# Patient Record
Sex: Male | Born: 1960 | Race: White | Hispanic: No | Marital: Single | Smoking: Former smoker
Health system: Southern US, Community
[De-identification: ages and names within clinical notes are randomized; demographics above are authoritative.]

## PROBLEM LIST (undated history)

## (undated) DIAGNOSIS — M1990 Unspecified osteoarthritis, unspecified site: Secondary | ICD-10-CM

## (undated) DIAGNOSIS — I723 Aneurysm of iliac artery: Secondary | ICD-10-CM

## (undated) DIAGNOSIS — J449 Chronic obstructive pulmonary disease, unspecified: Secondary | ICD-10-CM

## (undated) DIAGNOSIS — E785 Hyperlipidemia, unspecified: Secondary | ICD-10-CM

## (undated) DIAGNOSIS — I10 Essential (primary) hypertension: Secondary | ICD-10-CM

## (undated) DIAGNOSIS — M7021 Olecranon bursitis, right elbow: Secondary | ICD-10-CM

## (undated) DIAGNOSIS — Z720 Tobacco use: Secondary | ICD-10-CM

## (undated) DIAGNOSIS — T8859XA Other complications of anesthesia, initial encounter: Secondary | ICD-10-CM

## (undated) HISTORY — PX: OTHER SURGICAL HISTORY: SHX169

## (undated) HISTORY — DX: Tobacco use: Z72.0

## (undated) HISTORY — DX: Hyperlipidemia, unspecified: E78.5

## (undated) HISTORY — PX: INGUINAL HERNIA REPAIR: SUR1180

## (undated) HISTORY — PX: ULNAR NERVE REPAIR: SHX2594

---

## 1997-12-13 ENCOUNTER — Emergency Department (HOSPITAL_COMMUNITY): Admission: EM | Admit: 1997-12-13 | Discharge: 1997-12-13 | Payer: Self-pay | Admitting: Emergency Medicine

## 1999-11-23 LAB — HM COLONOSCOPY: HM Colonoscopy: NORMAL

## 2000-10-21 ENCOUNTER — Emergency Department (HOSPITAL_COMMUNITY): Admission: EM | Admit: 2000-10-21 | Discharge: 2000-10-21 | Payer: Self-pay | Admitting: Internal Medicine

## 2005-06-19 ENCOUNTER — Ambulatory Visit (HOSPITAL_COMMUNITY): Admission: RE | Admit: 2005-06-19 | Discharge: 2005-06-19 | Payer: Self-pay | Admitting: Specialist

## 2011-10-06 ENCOUNTER — Encounter (HOSPITAL_COMMUNITY): Payer: Self-pay | Admitting: Emergency Medicine

## 2011-10-06 ENCOUNTER — Emergency Department (HOSPITAL_COMMUNITY): Payer: BC Managed Care – PPO

## 2011-10-06 ENCOUNTER — Emergency Department (HOSPITAL_COMMUNITY)
Admission: EM | Admit: 2011-10-06 | Discharge: 2011-10-06 | Disposition: A | Discharge status: Home / Self Care | Payer: BC Managed Care – PPO | Attending: Emergency Medicine | Admitting: Emergency Medicine

## 2011-10-06 DIAGNOSIS — R110 Nausea: Secondary | ICD-10-CM | POA: Insufficient documentation

## 2011-10-06 DIAGNOSIS — R1031 Right lower quadrant pain: Secondary | ICD-10-CM | POA: Insufficient documentation

## 2011-10-06 LAB — URINALYSIS, ROUTINE W REFLEX MICROSCOPIC
Protein, ur: NEGATIVE mg/dL
Urobilinogen, UA: 1 mg/dL (ref 0.0–1.0)

## 2011-10-06 LAB — DIFFERENTIAL
Basophils Absolute: 0 10*3/uL (ref 0.0–0.1)
Basophils Relative: 0 % (ref 0–1)
Eosinophils Absolute: 0.2 10*3/uL (ref 0.0–0.7)
Eosinophils Relative: 2 % (ref 0–5)
Lymphocytes Relative: 35 % (ref 12–46)
Lymphs Abs: 2.6 10*3/uL (ref 0.7–4.0)
Monocytes Absolute: 0.4 10*3/uL (ref 0.1–1.0)
Monocytes Relative: 6 % (ref 3–12)
Neutro Abs: 4.1 10*3/uL (ref 1.7–7.7)
Neutrophils Relative %: 56 % (ref 43–77)

## 2011-10-06 LAB — CBC
Hemoglobin: 17 g/dL (ref 13.0–17.0)
MCH: 30.5 pg (ref 26.0–34.0)
MCV: 89.6 fL (ref 78.0–100.0)
Platelets: 176 10*3/uL (ref 150–400)
RBC: 5.58 MIL/uL (ref 4.22–5.81)

## 2011-10-06 LAB — URINE MICROSCOPIC-ADD ON

## 2011-10-06 LAB — BASIC METABOLIC PANEL
BUN: 15 mg/dL (ref 6–23)
Calcium: 9.1 mg/dL (ref 8.4–10.5)
Creatinine, Ser: 1.1 mg/dL (ref 0.50–1.35)
GFR calc non Af Amer: 77 mL/min — ABNORMAL LOW (ref >90)
Glucose, Bld: 154 mg/dL — ABNORMAL HIGH (ref 70–99)

## 2011-10-06 MED ORDER — ONDANSETRON HCL 4 MG/2ML IJ SOLN
4.0000 mg | Freq: Once | INTRAMUSCULAR | Status: AC
Start: 2011-10-06 — End: 2011-10-06
  Administered 2011-10-06: 4 mg via INTRAVENOUS
  Filled 2011-10-06: qty 2

## 2011-10-06 MED ORDER — KETOROLAC TROMETHAMINE 30 MG/ML IJ SOLN
30.0000 mg | Freq: Once | INTRAMUSCULAR | Status: AC
  Administered 2011-10-06: 30 mg via INTRAVENOUS
  Filled 2011-10-06: qty 1

## 2011-10-06 MED ORDER — ONDANSETRON HCL 4 MG PO TABS: 4.0000 mg | ORAL_TABLET | Freq: Four times a day (QID) | ORAL | Status: AC

## 2011-10-06 MED ORDER — HYDROCODONE-ACETAMINOPHEN 5-325 MG PO TABS: 2.0000 | ORAL_TABLET | ORAL | Status: AC | PRN

## 2011-10-06 MED ORDER — MORPHINE SULFATE 4 MG/ML IJ SOLN
4.0000 mg | Freq: Once | INTRAMUSCULAR | Status: AC
  Administered 2011-10-06: 4 mg via INTRAVENOUS
  Filled 2011-10-06: qty 1

## 2011-10-06 MED ORDER — IBUPROFEN 600 MG PO TABS: 600.0000 mg | ORAL_TABLET | Freq: Four times a day (QID) | ORAL | Status: AC | PRN

## 2011-10-06 MED ORDER — HYDROMORPHONE HCL PF 1 MG/ML IJ SOLN
1.0000 mg | Freq: Once | INTRAMUSCULAR | Status: AC
  Administered 2011-10-06: 1 mg via INTRAVENOUS

## 2011-10-06 MED ORDER — HYDROMORPHONE HCL PF 1 MG/ML IJ SOLN
1.0000 mg | Freq: Once | INTRAMUSCULAR | Status: AC
  Administered 2011-10-06: 1 mg via INTRAVENOUS
  Filled 2011-10-06: qty 1

## 2011-10-06 MED ORDER — IOHEXOL 300 MG/ML  SOLN
100.0000 mL | Freq: Once | INTRAMUSCULAR | Status: AC | PRN
  Administered 2011-10-06: 100 mL via INTRAVENOUS

## 2011-10-06 MED ORDER — ONDANSETRON 4 MG PO TBDP
4.0000 mg | ORAL_TABLET | Freq: Once | ORAL | Status: AC
  Administered 2011-10-06: 4 mg via ORAL
  Filled 2011-10-06: qty 1

## 2011-10-06 NOTE — ED Notes (Signed)
Pt presented to the ER with c/o right lowe quadrant pain, 10/10, sharp and constant, pt states that pain started yesterday, not able to control, this am woken up by pain and in "sweat", pt denies taking any meds at home. Pt further states that no Hx of abd pain.

## 2011-10-06 NOTE — ED Provider Notes (Signed)
History     CSN: [id number]  Arrival date & time 10/06/11  0272   First MD Initiated Contact with Patient 10/06/11 [phone]      Chief Complaint  Patient presents with  . Abdominal Pain    right lower quadrant    (Consider location/radiation/quality/duration/timing/severity/associated sxs/prior treatment) HPI  Pt presents to the ED with complaints of RLQ abdominal pain that started yesterday. He had some nausea with it. This morning the pain became unbearable with a 10/10 pain that is sharp, therefore he came to the ED. He has not tried to take anything for this problem yet. He denies history of abdominal pains and states that he still has his appendix and gallbladder. The patient denies any other concerns at this time which includes weakness, fatigue, CP, SOB, wheezing, confusion.  History reviewed. No pertinent past medical history.  Past Surgical History  Procedure Date  . Hernia repair   . Elbow surgery     History reviewed. No pertinent family history.  History  Substance Use Topics  . Smoking status: Current Some Day Smoker  . Smokeless tobacco: Not on file  . Alcohol Use: No      Review of Systems  All other systems reviewed and are negative.    Allergies  Review of patient's allergies indicates no known allergies.  Home Medications   Current Outpatient Rx  Name Route Sig Dispense Refill  . HYDROCODONE-ACETAMINOPHEN 5-325 MG PO TABS Oral Take 2 tablets by mouth every 4 (four) hours as needed for pain. 15 tablet 0  . IBUPROFEN 600 MG PO TABS Oral Take 1 tablet (600 mg total) by mouth every 6 (six) hours as needed for pain. 30 tablet 0  . ONDANSETRON HCL 4 MG PO TABS Oral Take 1 tablet (4 mg total) by mouth every 6 (six) hours. 12 tablet 0    BP 131/86  Pulse 75  Temp(Src) 98 F (36.7 C) (Oral)  Resp 18  Wt 220 lb (99.791 kg)  SpO2 95%  Physical Exam  Nursing note and vitals reviewed. Constitutional: He appears well-developed and well-nourished. No  distress.  HENT:  Head: Normocephalic and atraumatic.  Eyes: Pupils are equal, round, and reactive to light.  Neck: Normal range of motion. Neck supple.  Cardiovascular: Normal rate and regular rhythm.   Pulmonary/Chest: Effort normal.  Abdominal: Soft. Bowel sounds are normal. There is tenderness in the right lower quadrant. There is guarding. There is no rigidity, no rebound, no tenderness at McBurney's point and negative Murphy's sign.    Neurological: He is alert.  Skin: Skin is warm and dry.    ED Course  Procedures (including critical care time)  Labs Reviewed  BASIC METABOLIC PANEL - Abnormal; Notable for the following:    Glucose, Bld 154 (*)    GFR calc non Af Amer 77 (*)    GFR calc Af Amer 89 (*)    All other components within normal limits  URINALYSIS, ROUTINE W REFLEX MICROSCOPIC - Abnormal; Notable for the following:    Specific Gravity, Urine 1.031 (*)    Hgb urine dipstick MODERATE (*)    Bilirubin Urine SMALL (*)    Ketones, ur TRACE (*)    Leukocytes, UA SMALL (*)    All other components within normal limits  URINE MICROSCOPIC-ADD ON - Abnormal; Notable for the following:    Bacteria, UA FEW (*)    All other components within normal limits  CBC  DIFFERENTIAL   Ct Abdomen Pelvis W Contrast  10/06/2011  *RADIOLOGY REPORT*  Clinical Data: Right lower quadrant pain for 2 days.  CT ABDOMEN AND PELVIS WITH CONTRAST  Technique:  Multidetector CT imaging of the abdomen and pelvis was performed following the standard protocol during bolus administration of intravenous contrast.  Contrast:  100 ml Omnipaque-300.  Comparison: None.  Findings: Diffuse inflammation fat planes of the ascending colon laterally and anteriorly having an appearance most suggestive of omental infarct.  Epiploic appendagitis is a secondary less likely consideration.  The appendix is separate from this region and does not appear inflamed.  No cecal diverticula seen as a cause for this inflammation.   No abscess collection, free fluid or free air.  Three too small to characterize liver lesions statistically representing cysts or hamartomas.  No focal splenic, pancreatic, adrenal or right renal lesion. Left renal 9 mm low density lesion probably cyst although too small to characterize.  No calcified gallstones.  Advanced atherosclerotic type changes for patient's age with plaque along the abdominal aorta and possibly coronary artery calcifications.  The aorta is ectatic with mild bulge infrarenal position at the takeoff of the inferior mesenteric artery with transverse dimension of 2.4 x 2.3 cm whereas above this level of the aorta measures 2.1 x 1.8 cm.  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  Post stenotic dilation celiac artery.  Asymmetric calcifications of the prostate gland with nodularity on the left.  Clinical and laboratory correlation recommended to exclude malignancy.  Noncontrast filled views of the urinary bladder unremarkable.  Degenerative changes lumbar spine without bony destructive lesion.  Mild fatty containing right inguinal hernia.  Bilateral hip joint degenerative changes.  IMPRESSION:  Diffuse inflammation fat planes of the ascending colon laterally and anteriorly having an appearance most suggestive of omental infarct.  Please see above discussion.  Advanced atherosclerotic type changes for patient's age with plaque along the abdominal aorta and possibly coronary artery calcifications.  The aorta is ectatic with mild bulge infrarenal position at the takeoff of the inferior mesenteric artery with transverse dimension of 2.4 x 2.3 cm whereas above this level of the aorta measures 2.1 x 1.8 cm.  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  Asymmetric calcifications of the prostate gland with nodularity on the left.  Clinical and laboratory correlation recommended to exclude  malignancy.  Original Report Authenticated By: Fuller Canada, M.D.     1. Abdominal pain       MDM  Pt CT shows omentum infarct vs Epiploic Appendagitis. Both of which the treatment for is pain management. Pt given Rx for Ibuprofen 600mg  and Vicodin.  Pt discussed with Dr. Patria Mane who has gone to speak with and evaluate patient.   Pt given 3mg  IV dilaudid and 30mg  IV Toradol to Control pain. Pt has family friends here to take him home and keep an eye one him.  Pt has been advised of the symptoms that warrant their return to the ED. Patient has voiced understanding and has agreed to follow-up with the PCP or specialist.         Dorthula Matas, PA 10/06/11 1044

## 2011-10-06 NOTE — ED Provider Notes (Signed)
Medical screening examination/treatment/procedure(s) were conducted as a shared visit with non-physician practitioner(s) and myself.  I personally evaluated the patient during the encounter  The patient's pain is improved at this time.  I personally reviewed his CT scan it appears as though he has either omental infarct versus and epiploic appendagitis.  His appendix is normal on CT scan.  His white count and vital signs are normal.  DC home with pain medications.  The patient understands to return to the ER for new or worsening symptoms  1. Abdominal pain    Ct Abdomen Pelvis W Contrast  10/06/2011  *RADIOLOGY REPORT*  Clinical Data: Right lower quadrant pain for 2 days.  CT ABDOMEN AND PELVIS WITH CONTRAST  Technique:  Multidetector CT imaging of the abdomen and pelvis was performed following the standard protocol during bolus administration of intravenous contrast.  Contrast:  100 ml Omnipaque-300.  Comparison: None.  Findings: Diffuse inflammation fat planes of the ascending colon laterally and anteriorly having an appearance most suggestive of omental infarct.  Epiploic appendagitis is a secondary less likely consideration.  The appendix is separate from this region and does not appear inflamed.  No cecal diverticula seen as a cause for this inflammation.  No abscess collection, free fluid or free air.  Three too small to characterize liver lesions statistically representing cysts or hamartomas.  No focal splenic, pancreatic, adrenal or right renal lesion. Left renal 9 mm low density lesion probably cyst although too small to characterize.  No calcified gallstones.  Advanced atherosclerotic type changes for patient's age with plaque along the abdominal aorta and possibly coronary artery calcifications.  The aorta is ectatic with mild bulge infrarenal position at the takeoff of the inferior mesenteric artery with transverse dimension of 2.4 x 2.3 cm whereas above this level of the aorta measures 2.1 x 1.8  cm.  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  Post stenotic dilation celiac artery.  Asymmetric calcifications of the prostate gland with nodularity on the left.  Clinical and laboratory correlation recommended to exclude malignancy.  Noncontrast filled views of the urinary bladder unremarkable.  Degenerative changes lumbar spine without bony destructive lesion.  Mild fatty containing right inguinal hernia.  Bilateral hip joint degenerative changes.  IMPRESSION:  Diffuse inflammation fat planes of the ascending colon laterally and anteriorly having an appearance most suggestive of omental infarct.  Please see above discussion.  Advanced atherosclerotic type changes for patient's age with plaque along the abdominal aorta and possibly coronary artery calcifications.  The aorta is ectatic with mild bulge infrarenal position at the takeoff of the inferior mesenteric artery with transverse dimension of 2.4 x 2.3 cm whereas above this level of the aorta measures 2.1 x 1.8 cm.  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  Asymmetric calcifications of the prostate gland with nodularity on the left.  Clinical and laboratory correlation recommended to exclude malignancy.  Original Report Authenticated By: Fuller Canada, M.D.    Lyanne Co, MD 10/06/11 1104

## 2011-10-06 NOTE — ED Notes (Signed)
Continues to be pale, dry heaves, wanting to go home. Discharged in w/c with his friend.

## 2011-10-06 NOTE — ED Notes (Signed)
Continues to have dry heaves, since last dose of dilaudid- IV d/c'd-

## 2011-10-06 NOTE — Discharge Instructions (Signed)

## 2011-10-09 ENCOUNTER — Telehealth: Payer: Self-pay | Admitting: Gastroenterology

## 2011-10-09 NOTE — Telephone Encounter (Signed)
Pt has been scheduled with Gunnar Fusi for 10/10/11 830 am

## 2011-10-10 ENCOUNTER — Encounter: Payer: Self-pay | Admitting: Nurse Practitioner

## 2011-10-10 ENCOUNTER — Ambulatory Visit (INDEPENDENT_AMBULATORY_CARE_PROVIDER_SITE_OTHER): Payer: BC Managed Care – PPO | Admitting: Nurse Practitioner

## 2011-10-10 VITALS — BP 120/94 | HR 96 | Ht 76.0 in | Wt 210.0 lb

## 2011-10-10 DIAGNOSIS — R933 Abnormal findings on diagnostic imaging of other parts of digestive tract: Secondary | ICD-10-CM

## 2011-10-10 DIAGNOSIS — R1110 Vomiting, unspecified: Secondary | ICD-10-CM | POA: Insufficient documentation

## 2011-10-10 MED ORDER — PROMETHAZINE HCL 25 MG RE SUPP: 25.0000 mg | RECTAL | Status: DC | PRN

## 2011-10-10 MED ORDER — METRONIDAZOLE 250 MG PO TABS: ORAL_TABLET | ORAL | Status: DC

## 2011-10-10 NOTE — Patient Instructions (Addendum)
You have been given a separate informational sheet regarding your tobacco use, the importance of quitting and local resources to help you quit. . We sent prescriptions for Phenergan suppositories and Flagyl ( antibiotic).    We will call you with appointments for the Vascular [facility] Vein specialists and [facility].

## 2011-10-11 ENCOUNTER — Encounter: Payer: Self-pay | Admitting: Nurse Practitioner

## 2011-10-11 NOTE — Progress Notes (Signed)
10/11/2011 Rodney Williams [id number] April 13, 1961   HISTORY OF PRESENT ILLNESS: Patient is a 51 year old male who woke up last Friday    morning with severe RLQ pain, nausea, vomiting, and a headache. He went to the ED where labs were unremarkable but CT scan of abdomen and pelvis showed diffuse inflammation of the fat planes of the ascending colon suggestive of epiploic appendigitis vrs.omental infarct. The RLQ pain is better, mainly just a pressure type discomfort now, but he continues to have frequent nausea and vomiting. Zofran helps but no sustained response. Emesis not bloody, no melena.    History reviewed. No pertinent past medical history. Past Surgical History  Procedure Date  . Inguinal hernia repair     left  . Ulnar nerve repair     left    reports that he has been smoking.  He has never used smokeless tobacco. He reports that he does not drink alcohol or use illicit drugs. family history includes Diabetes in his father. No Known Allergies    Outpatient Encounter Prescriptions as of 10/10/2011  Medication Sig Dispense Refill  . HYDROcodone-acetaminophen (NORCO) 5-325 MG per tablet Take 2 tablets by mouth every 4 (four) hours as needed for pain.  15 tablet  0  . ibuprofen (ADVIL,MOTRIN) 600 MG tablet Take 1 tablet (600 mg total) by mouth every 6 (six) hours as needed for pain.  30 tablet  0  . ondansetron (ZOFRAN) 4 MG tablet Take 1 tablet (4 mg total) by mouth every 6 (six) hours.  12 tablet  0  . metroNIDAZOLE (FLAGYL) 250 MG tablet Take 1 tab 3 times a day for 10 days..  30 tablet  0  . promethazine (PHENERGAN) 25 MG suppository Place 1 suppository (25 mg total) rectally every 4 (four) hours as needed for nausea.  12 each  1   CT ABDOMEN AND PELVIS WITH CONTRAST  Technique: Multidetector CT imaging of the abdomen and pelvis was  performed following the standard protocol during bolus  administration of intravenous contrast.  Contrast: 100 ml Omnipaque-300.  Comparison:  None.  Findings: Diffuse inflammation fat planes of the ascending colon  laterally and anteriorly having an appearance most suggestive of  omental infarct. Epiploic appendagitis is a secondary less likely  consideration. The appendix is separate from this region and does  not appear inflamed. No cecal diverticula seen as a cause for this  inflammation. No abscess collection, free fluid or free air.  Three too small to characterize liver lesions statistically  representing cysts or hamartomas.  No focal splenic, pancreatic, adrenal or right renal lesion. Left  renal 9 mm low density lesion probably cyst although too small to   characterize.  No calcified gallstones.  Advanced atherosclerotic type changes for patient's age with plaque  along the abdominal aorta and possibly coronary artery  calcifications. The aorta is ectatic with mild bulge infrarenal  position at the takeoff of the inferior mesenteric artery with  transverse dimension of 2.4 x 2.3 cm whereas above this level of  the aorta measures 2.1 x 1.8 cm.  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  Post stenotic dilation celiac artery.  Asymmetric calcifications of the prostate gland with nodularity on  the left. Clinical and laboratory correlation recommended to  exclude malignancy.  Noncontrast filled views of the urinary bladder unremarkable.  Degenerative changes lumbar spine without bony destructive lesion.  Mild fatty containing right inguinal hernia.  Bilateral hip joint degenerative changes.  IMPRESSION:  Diffuse inflammation fat planes of the ascending colon laterally  and anteriorly having an appearance most suggestive of omental  infarct. Please see above discussion.  Advanced atherosclerotic type changes for patient's age with plaque  along the abdominal aorta and possibly coronary artery  calcifications. The aorta is ectatic with mild bulge infrarenal    position at the takeoff of the inferior mesenteric artery with  transverse dimension of 2.4 x 2.3 cm whereas above this level of  the aorta measures 2.1 x 1.8 cm.  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  Asymmetric calcifications of the prostate gland with nodularity on  the left. Clinical and laboratory correlation recommended to  exclude malignancy.  Original Report Authenticated By: Fuller Canada, M.D.      REVIEW OF SYSTEMS  : All other systems reviewed and negative except where noted in the History of Present Illness.   PHYSICAL EXAM: BP 120/94  Pulse 96  Ht 6\' 4"  (1.93 m)  Wt 210 lb (95.255 kg)  BMI 25.56 kg/m2 General: Well developed white male in no acute distress Head: Normocephalic and atraumatic Eyes:  sclerae anicteric,conjunctive pink. Ears: Normal auditory acuity Mouth: No deformity or lesions Neck: Supple, no masses.  Lungs: Clear throughout to auscultation Heart: Regular rate and rhythm Abdomen: Soft, non distended, mild -moderate RLQ tenderness. No masses or hepatomegaly noted. Normal Bowel sounds Rectal: not done Musculoskeletal: Symmetrical with no gross deformities  Skin: No lesions on visible extremities Extremities: No edema or deformities noted Neurological: Alert oriented, grossly nonfocal Cervical Nodes:  No significant cervical adenopathy Psychological:  Alert and cooperative. Normal mood and affect  ASSESSMENT AND PLAN;  1. Epiploic appendagitis vrs omental infarct, radiologist favors later. Etiology of an omental infarct not clear but there are several vascular abnormalities on CTscan which need to be evaluated. His pain has improved.  Epiploic appendagitis is usually a self-limiting condition but if this was an infarct patient may be at risk for future episodes. We have referred him to the vascular clinic.   2. nausea and vomiting, started with #1. Etiology not clear but he may not be  absorbing the Zofran tablets. Will change him to Phernergan suppositories, push oral fluids. If no improvement in next few days patient may need EGD.   3. Abnormal prostate on CTscan. Patient doesn't have a PCP. We have referred him to [facility] who can further evaluate prostate and refer him to urology is necessary.  3. Tobacco. We discussed need to discontinue smoking, especially in light of vascular disease.

## 2011-10-13 ENCOUNTER — Telehealth: Payer: Self-pay | Admitting: Nurse Practitioner

## 2011-10-13 ENCOUNTER — Encounter: Payer: Self-pay | Admitting: *Deleted

## 2011-10-13 NOTE — Telephone Encounter (Signed)
Spoke with patient and he states he has stopped all medications except antibiotics. He is feeling"shakey and his heart rate has been up(112). States his BP is normal. He is concerned the Flagyl is causing this. Spoke with Willette Cluster, NP and she does not think the Flagyl is the problem. Patient needs to push po's?dehydration.

## 2011-10-13 NOTE — Telephone Encounter (Signed)
Spoke with patient and gave him Willette Cluster, NP recommendation. Patient will push po fluids and if he continues to feel bad he will go to Urgent Care or ED for evaluation

## 2011-10-27 ENCOUNTER — Encounter: Payer: Self-pay | Admitting: Surgery

## 2011-10-30 ENCOUNTER — Ambulatory Visit (INDEPENDENT_AMBULATORY_CARE_PROVIDER_SITE_OTHER): Payer: BC Managed Care – PPO | Admitting: Surgery

## 2011-10-30 ENCOUNTER — Encounter: Payer: Self-pay | Admitting: Surgery

## 2011-10-30 VITALS — BP 134/108 | HR 76 | Resp 18 | Ht 76.0 in | Wt 213.2 lb

## 2011-10-30 DIAGNOSIS — I7092 Chronic total occlusion of artery of the extremities: Secondary | ICD-10-CM

## 2011-10-30 DIAGNOSIS — I723 Aneurysm of iliac artery: Secondary | ICD-10-CM

## 2011-10-30 NOTE — Progress Notes (Signed)
[facility] [place]   Patient name: Rodney Williams MRN: [id number] DOB: 15-Nov-1960 Sex: male   Referred by: Jeanelle Malling  Reason for referral:  Chief Complaint  Patient presents with  . New Evaluation    recent CT of abdomen/pelvis 10-06-2011  . PAD    HISTORY OF PRESENT ILLNESS: Patient comes in today for followup of a recent CT scan he had. He recently had a CT of the abdomen and pelvis in the setting of a right lower quadrant pain as well as emesis. A CT scan identified a right iliac aneurysm and he is here today for further evaluation of this. Maximum diameter was 2.2 cm. The patient is a smoker. He has recently had issues with high blood pressure. He also states that he gets dizzy when he stands up, ever since he had his abdominal pain episode. He denies symptoms of claudication. He denies focal neurologic deficits.  Past Medical History  Diagnosis Date  . Tobacco abuse     smoke for 40 years    Past Surgical History  Procedure Date  . Inguinal hernia repair     left  . Ulnar nerve repair     left  . Ulnar nerve surgery per pt. 7-8 years ago    Left elbow and wrist    History   Social History  . Marital Status: Single    Spouse Name: N/A    Number of Children: 0  . Years of Education: N/A   Occupational History  . installer    Social History Main Topics  . Smoking status: Current Some Day Smoker -- 2.0 packs/day for 40 years    Types: Cigarettes  . Smokeless tobacco: Never Used  . Alcohol Use: No  . Drug Use: No  . Sexually Active: Not on file   Other Topics Concern  . Not on file   Social History Narrative  . No narrative on file    Family History  Problem Relation Age of Onset  . Diabetes Father     Allergies as of 10/30/2011  . (No Known Allergies)    Current Outpatient Prescriptions on File Prior to Visit  Medication Sig Dispense Refill  . metroNIDAZOLE (FLAGYL) 250 MG tablet Take 1 tab 3 times a day for 10 days..  30  tablet  0  . promethazine (PHENERGAN) 25 MG suppository Place 1 suppository (25 mg total) rectally every 4 (four) hours as needed for nausea.  12 each  1     REVIEW OF SYSTEMS: Please see history of present illness. Also positive for palpitations and dizziness. All other systems are negative  PHYSICAL EXAMINATION: General: The patient appears their stated age.  Vital signs are BP 134/108  Pulse 76  Resp 18  Ht 6\' 4"  (1.93 m)  Wt 213 lb 3.2 oz (96.707 kg)  BMI 25.95 kg/m2 HEENT:  No gross abnormalities Pulmonary: Respirations are non-labored Abdomen: Soft and non-tender  Musculoskeletal: There are no major deformities.   Neurologic: No focal weakness or paresthesias are detected, Skin: There are no ulcer or rashes noted. Psychiatric: The patient has normal affect. Cardiovascular: There is a regular rate and rhythm without significant murmur appreciated. No carotid bruits, palpable pedal pulses bilaterally  Diagnostic Studies: I have reviewed his CT scan. 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 In addition he is asymptomatic. I  would not recommend repair into a gross to greater than 3 cm. I will have him come back to see me in one year with an abdominal ultrasound. I also discussed the importance of smoking cessation.     Jorge Ny, M.D. [facility] [place] Office: [phone] Pager:  [phone]

## 2011-10-31 NOTE — Progress Notes (Signed)
Addended by: Sharee Pimple on: 10/31/2011 08:42 AM   Modules accepted: Orders

## 2011-11-23 ENCOUNTER — Other Ambulatory Visit (INDEPENDENT_AMBULATORY_CARE_PROVIDER_SITE_OTHER): Payer: BC Managed Care – PPO

## 2011-11-23 ENCOUNTER — Ambulatory Visit (INDEPENDENT_AMBULATORY_CARE_PROVIDER_SITE_OTHER): Payer: BC Managed Care – PPO | Admitting: Internal Medicine

## 2011-11-23 ENCOUNTER — Encounter: Payer: Self-pay | Admitting: Internal Medicine

## 2011-11-23 VITALS — BP 118/80 | HR 88 | Temp 97.8°F | Resp 16 | Ht 76.0 in | Wt 219.0 lb

## 2011-11-23 DIAGNOSIS — Z13220 Encounter for screening for lipoid disorders: Secondary | ICD-10-CM

## 2011-11-23 DIAGNOSIS — Z0000 Encounter for general adult medical examination without abnormal findings: Secondary | ICD-10-CM | POA: Insufficient documentation

## 2011-11-23 DIAGNOSIS — R7309 Other abnormal glucose: Secondary | ICD-10-CM

## 2011-11-23 DIAGNOSIS — Z136 Encounter for screening for cardiovascular disorders: Secondary | ICD-10-CM | POA: Insufficient documentation

## 2011-11-23 LAB — LIPID PANEL
Cholesterol: 171 mg/dL (ref 0–200)
Triglycerides: 272 mg/dL — ABNORMAL HIGH (ref 0.0–149.0)

## 2011-11-23 LAB — CBC WITH DIFFERENTIAL/PLATELET
Basophils Absolute: 0 10*3/uL (ref 0.0–0.1)
Eosinophils Absolute: 0.2 10*3/uL (ref 0.0–0.7)
Lymphocytes Relative: 40 % (ref 12.0–46.0)
MCHC: 33.6 g/dL (ref 30.0–36.0)
Neutrophils Relative %: 49.7 % (ref 43.0–77.0)
Platelets: 178 10*3/uL (ref 150.0–400.0)
RDW: 13.7 % (ref 11.5–14.6)

## 2011-11-23 LAB — URINALYSIS, ROUTINE W REFLEX MICROSCOPIC
Bilirubin Urine: NEGATIVE
Nitrite: NEGATIVE
Specific Gravity, Urine: >=1.03 (ref 1.000–1.030)
Total Protein, Urine: NEGATIVE
pH: 6 (ref 5.0–8.0)

## 2011-11-23 LAB — COMPREHENSIVE METABOLIC PANEL
Alkaline Phosphatase: 54 U/L (ref 39–117)
BUN: 18 mg/dL (ref 6–23)
CO2: 31 mEq/L (ref 19–32)
Calcium: 9.4 mg/dL (ref 8.4–10.5)
Creatinine, Ser: 1.2 mg/dL (ref 0.4–1.5)
Glucose, Bld: 67 mg/dL — ABNORMAL LOW (ref 70–99)
Sodium: 141 mEq/L (ref 135–145)
Total Bilirubin: 0.5 mg/dL (ref 0.3–1.2)
Total Protein: 6.7 g/dL (ref 6.0–8.3)

## 2011-11-23 NOTE — Assessment & Plan Note (Signed)
Exam done, vaccines were updated, labs ordered, pt ed material was given 

## 2011-11-23 NOTE — Patient Instructions (Signed)
Health Maintenance, Males A healthy lifestyle and preventative care can promote health and wellness.  Maintain regular health, dental, and eye exams.   Eat a healthy diet. Foods like vegetables, fruits, whole grains, low-fat dairy products, and lean protein foods contain the nutrients you need without too many calories. Decrease your intake of foods high in solid fats, added sugars, and salt. Get information about a proper diet from your caregiver, if necessary.   Regular physical exercise is one of the most important things you can do for your health. Most adults should get at least 150 minutes of moderate-intensity exercise (any activity that increases your heart rate and causes you to sweat) each week. In addition, most adults need muscle-strengthening exercises on 2 or more days a week.    Maintain a healthy weight. The body mass index (BMI) is a screening tool to identify possible weight problems. It provides an estimate of body fat based on height and weight. Your caregiver can help determine your BMI, and can help you achieve or maintain a healthy weight. For adults 20 years and older:   A BMI below 18.5 is considered underweight.   A BMI of 18.5 to 24.9 is normal.   A BMI of 25 to 29.9 is considered overweight.   A BMI of 30 and above is considered obese.   Maintain normal blood lipids and cholesterol by exercising and minimizing your intake of saturated fat. Eat a balanced diet with plenty of fruits and vegetables. Blood tests for lipids and cholesterol should begin at age 20 and be repeated every 5 years. If your lipid or cholesterol levels are high, you are over 50, or you are a high risk for heart disease, you may need your cholesterol levels checked more frequently.Ongoing high lipid and cholesterol levels should be treated with medicines, if diet and exercise are not effective.   If you smoke, find out from your caregiver how to quit. If you do not use tobacco, do not start.    If you choose to drink alcohol, do not exceed 2 drinks per day. One drink is considered to be 12 ounces (355 mL) of beer, 5 ounces (148 mL) of wine, or 1.5 ounces (44 mL) of liquor.   Avoid use of street drugs. Do not share needles with anyone. Ask for help if you need support or instructions about stopping the use of drugs.   High blood pressure causes heart disease and increases the risk of stroke. Blood pressure should be checked at least every 1 to 2 years. Ongoing high blood pressure should be treated with medicines if weight loss and exercise are not effective.   If you are 45 to 50 years old, ask your caregiver if you should take aspirin to prevent heart disease.   Diabetes screening involves taking a blood sample to check your fasting blood sugar level. This should be done once every 3 years, after age 45, if you are within normal weight and without risk factors for diabetes. Testing should be considered at a younger age or be carried out more frequently if you are overweight and have at least 1 risk factor for diabetes.   Colorectal cancer can be detected and often prevented. Most routine colorectal cancer screening begins at the age of 50 and continues through age 75. However, your caregiver may recommend screening at an earlier age if you have risk factors for colon cancer. On a yearly basis, your caregiver may provide home test kits to check for hidden   blood in the stool. Use of a small camera at the end of a tube, to directly examine the colon (sigmoidoscopy or colonoscopy), can detect the earliest forms of colorectal cancer. Talk to your caregiver about this at age 50, when routine screening begins. Direct examination of the colon should be repeated every 5 to 10 years through age 75, unless early forms of pre-cancerous polyps or small growths are found.   Hepatitis C blood testing is recommended for all people born from 1945 through 1965 and any individual with known risks for  hepatitis C.   Healthy men should no longer receive prostate-specific antigen (PSA) blood tests as part of routine cancer screening. Consult with your caregiver about prostate cancer screening.   Testicular cancer screening is not recommended for adolescents or adult males who have no symptoms. Screening includes self-exam, caregiver exam, and other screening tests. Consult with your caregiver about any symptoms you have or any concerns you have about testicular cancer.   Practice safe sex. Use condoms and avoid high-risk sexual practices to reduce the spread of sexually transmitted infections (STIs).   Use sunscreen with a sun protection factor (SPF) of 30 or greater. Apply sunscreen liberally and repeatedly throughout the day. You should seek shade when your shadow is shorter than you. Protect yourself by wearing long sleeves, pants, a wide-brimmed hat, and sunglasses year round, whenever you are outdoors.   Notify your caregiver of new moles or changes in moles, especially if there is a change in shape or color. Also notify your caregiver if a mole is larger than the size of a pencil eraser.   A one-time screening for abdominal aortic aneurysm (AAA) and surgical repair of large AAAs by sound wave imaging (ultrasonography) is recommended for ages 65 to 75 years who are current or former smokers.   Stay current with your immunizations.  Document Released: 12/16/2007 Document Revised: 06/08/2011 Document Reviewed: 11/14/2010 [employer] Patient Information 2012 [employer]. 

## 2011-11-23 NOTE — Assessment & Plan Note (Signed)
I will check an a1c today to see if has DM II

## 2011-11-23 NOTE — Progress Notes (Signed)
Subjective:    Patient ID: Rodney Williams, male    DOB: 01-May-1961, 51 y.o.   MRN: [id number]  HPI New to me he comes in today for a complete physical. He feels well with no complaints.   Review of Systems  Constitutional: Negative for fever, chills, diaphoresis, activity change, appetite change, fatigue and unexpected weight change.  HENT: Negative.   Eyes: Negative.   Respiratory: Negative for apnea, cough, choking, chest tightness, shortness of breath, wheezing and stridor.   Cardiovascular: Negative for chest pain and palpitations.  Gastrointestinal: Negative for nausea, vomiting, abdominal pain, diarrhea, constipation, blood in stool and abdominal distention.  Genitourinary: Negative for dysuria, urgency, frequency, hematuria, flank pain, decreased urine volume, discharge, penile swelling, scrotal swelling, enuresis, difficulty urinating, genital sores, penile pain and testicular pain.  Musculoskeletal: Negative for myalgias, back pain, joint swelling, arthralgias and gait problem.  Skin: Negative for color change, pallor, rash and wound.  Neurological: Negative.   Hematological: Negative for adenopathy. Does not bruise/bleed easily.  Psychiatric/Behavioral: Negative.        Objective:   Physical Exam  Vitals reviewed. Constitutional: He is oriented to person, place, and time. He appears well-developed and well-nourished. No distress.  HENT:  Head: Normocephalic and atraumatic.  Mouth/Throat: Oropharynx is clear and moist. No oropharyngeal exudate.  Eyes: Conjunctivae are normal. Right eye exhibits no discharge. Left eye exhibits no discharge. No scleral icterus.  Neck: Normal range of motion. Neck supple. No JVD present. No tracheal deviation present. No thyromegaly present.  Cardiovascular: Normal rate, regular rhythm, normal heart sounds and intact distal pulses.  Exam reveals no gallop and no friction rub.   No murmur heard. Pulmonary/Chest: Effort normal and breath  sounds normal. No stridor. No respiratory distress. He has no wheezes. He has no rales. He exhibits no tenderness.  Abdominal: Soft. Bowel sounds are normal. He exhibits no distension and no mass. There is no tenderness. There is no rebound and no guarding. Hernia confirmed negative in the right inguinal area and confirmed negative in the left inguinal area.  Genitourinary: Prostate normal, testes normal and penis normal. Rectal exam shows external hemorrhoid and internal hemorrhoid. Rectal exam shows no fissure, no mass and no tenderness. Guaiac negative stool. Prostate is not enlarged and not tender. Right testis shows no mass, no swelling and no tenderness. Right testis is descended. Left testis shows no mass, no swelling and no tenderness. Left testis is descended. Circumcised. No penile tenderness. No discharge found.  Musculoskeletal: Normal range of motion. He exhibits no edema and no tenderness.  Lymphadenopathy:    He has no cervical adenopathy.       Right: No inguinal adenopathy present.       Left: No inguinal adenopathy present.  Neurological: He is oriented to person, place, and time.  Skin: Skin is warm and dry. No rash noted. He is not diaphoretic. No erythema. No pallor.  Psychiatric: He has a normal mood and affect. His behavior is normal. Judgment and thought content normal.     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  **END ADDENDUM** SIGNED BY: Almedia Balls. Constance Goltz, M.D.    10/06/2011  *RADIOLOGY REPORT*  Clinical Data: Right lower quadrant pain for 2  days.  CT ABDOMEN AND PELVIS WITH CONTRAST  Technique:  Multidetector CT imaging of the abdomen and pelvis was performed following the standard protocol during bolus administration of intravenous contrast.  Contrast:  100 ml Omnipaque-300.  Comparison: None.  Findings: Diffuse inflammation fat planes of the ascending colon laterally and anteriorly having an appearance most suggestive of omental infarct.  Epiploic appendagitis is a secondary less likely consideration.  The appendix is separate from this region and does not appear inflamed.  No cecal diverticula seen as a cause for this inflammation.  No abscess collection, free fluid or free air.  Three too small to characterize liver lesions statistically representing cysts or hamartomas.  No focal splenic, pancreatic, adrenal or right renal lesion. Left renal 9 mm low density lesion probably cyst although too small to characterize.  No calcified gallstones.  Advanced atherosclerotic type changes for patient's age with plaque along the abdominal aorta and possibly coronary artery calcifications.  The aorta is ectatic with mild bulge infrarenal position at the takeoff of the inferior mesenteric artery with transverse dimension of 2.4 x 2.3 cm whereas above this level of the aorta measures 2.1 x 1.8 cm.  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  Post stenotic dilation celiac artery.  Asymmetric calcifications of the prostate gland with nodularity on the left.  Clinical and laboratory correlation recommended to exclude malignancy.  Noncontrast filled views of the urinary bladder unremarkable.  Degenerative changes lumbar spine without bony destructive lesion.  Mild fatty containing right inguinal hernia.  Bilateral hip joint degenerative changes.  IMPRESSION:  Diffuse inflammation fat planes of the ascending colon laterally and anteriorly having an appearance most suggestive of omental infarct.  Please see above  discussion.  Advanced atherosclerotic type changes for patient's age with plaque along the abdominal aorta and possibly coronary artery calcifications.  The aorta is ectatic with mild bulge infrarenal position at the takeoff of the inferior mesenteric artery with transverse dimension of 2.4 x 2.3 cm whereas above this level of the aorta measures 2.1 x 1.8 cm.  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  Asymmetric calcifications of the prostate gland with nodularity on the left.  Clinical and laboratory correlation recommended to exclude malignancy.  Original Report Authenticated By: Fuller Canada, M.D.     Assessment & Plan:

## 2011-11-23 NOTE — Assessment & Plan Note (Signed)
His EKG is normal 

## 2011-11-28 ENCOUNTER — Encounter: Payer: Self-pay | Admitting: Internal Medicine

## 2011-11-30 ENCOUNTER — Ambulatory Visit: Payer: BC Managed Care – PPO | Admitting: Internal Medicine

## 2012-10-25 ENCOUNTER — Encounter: Payer: Self-pay | Admitting: Surgery

## 2012-10-28 ENCOUNTER — Ambulatory Visit (INDEPENDENT_AMBULATORY_CARE_PROVIDER_SITE_OTHER): Payer: BC Managed Care – PPO | Admitting: Surgery

## 2012-10-28 ENCOUNTER — Encounter: Payer: Self-pay | Admitting: Surgery

## 2012-10-28 ENCOUNTER — Other Ambulatory Visit (INDEPENDENT_AMBULATORY_CARE_PROVIDER_SITE_OTHER): Payer: BC Managed Care – PPO | Admitting: Vascular Surgery

## 2012-10-28 VITALS — BP 139/99 | HR 60 | Resp 16 | Ht 76.0 in | Wt 214.0 lb

## 2012-10-28 DIAGNOSIS — I739 Peripheral vascular disease, unspecified: Secondary | ICD-10-CM

## 2012-10-28 DIAGNOSIS — I7092 Chronic total occlusion of artery of the extremities: Secondary | ICD-10-CM

## 2012-10-28 DIAGNOSIS — I723 Aneurysm of iliac artery: Secondary | ICD-10-CM

## 2012-10-28 DIAGNOSIS — I6529 Occlusion and stenosis of unspecified carotid artery: Secondary | ICD-10-CM

## 2012-10-28 NOTE — Addendum Note (Signed)
Addended by: Dannielle Karvonen on: 10/28/2012 02:54 PM   Modules accepted: Orders

## 2012-10-28 NOTE — Progress Notes (Signed)
[facility]   Patient name: Rodney Williams MRN: [id number] DOB: 19-Apr-1961 Sex: male     Chief Complaint  Patient presents with  . 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 This was discovered approximately a year ago during a workup for possible appendicitis. The patient denies any back pain or abdominal pain. He does report that he gets occasional leg pain and cramping. He continues to smoke and does not have any desire to quit. He is not taking any medications.  Past Medical History  Diagnosis Date  . Tobacco abuse     smoke for 40 years    Past Surgical History  Procedure Laterality Date  . Inguinal hernia repair      left  . Ulnar nerve repair      left  . Ulnar nerve surgery  per pt. 7-8 years ago    Left elbow and wrist    History   Social History  . Marital Status: Single    Spouse Name: N/A    Number of Children: 0  . Years of Education: N/A   Occupational History  . installer    Social History Main Topics  . Smoking status: Current Some Day Smoker -- 2.00 packs/day for 40 years    Types: Cigarettes  . Smokeless tobacco: Never Used  . Alcohol Use: No  . Drug Use: No  . Sexually Active: Yes   Other Topics Concern  . Not on file   Social History Narrative  . No narrative on file    Family History  Problem Relation Age of Onset  . Diabetes Father   . Alcohol abuse Neg Hx   . Cancer Neg Hx   . Early death Neg Hx   . Heart disease Neg Hx   . Hyperlipidemia Neg Hx   . Hypertension Neg Hx   . Stroke Neg Hx     Allergies as of 10/28/2012  . (No Known Allergies)    Current Outpatient Prescriptions on File Prior to Visit  Medication Sig Dispense Refill  . promethazine (PHENERGAN) 25 MG suppository Place 1 suppository (25 mg total) rectally every 4 (four) hours as needed for nausea.  12 each  1   No current  facility-administered medications on file prior to visit.     REVIEW OF SYSTEMS: All negative. Please see history of present illness  PHYSICAL EXAMINATION:   Vital signs are BP 139/99  Pulse 60  Resp 16  Ht 6\' 4"  (1.93 m)  Wt 214 lb (97.07 kg)  BMI 26.06 kg/m2  SpO2 98% General: The patient appears their stated age. HEENT:  No gross abnormalities Pulmonary:  Non labored breathing Abdomen: Soft and non-tender Musculoskeletal: There are no major deformities. Neurologic: No focal weakness or paresthesias are detected, Skin: There are no ulcer or rashes noted. Psychiatric: The patient has normal affect. Cardiovascular: There is a regular rate and rhythm without significant murmur appreciated. No carotid bruits. Femoral pulses. Nonpalpable pedal pulses   Diagnostic Studies Ultrasound was performed today. This shows a stable aneurysm in the right common iliac system measuring 2.3 cm.  Assessment: Right common iliac aneurysm Plan: The diameter of the aneurysm has remained fairly stable over the course of the past year. I'll repeat his duplex ultrasound in one year.  I had a long discussion with the patient regarding his long-term  health. He does not appear to be interested in smoking cessation at this time. I have discussed with them establishing a connection with his primary care physician. I told him I would start taking a baby aspirin. I also think he would benefit from being placed on a statin, now that we have identified him as high risk for peripheral vascular disease. He seemed motivated to reconnect with his primary care doctor. I spent approximately 30 minutes in face-to-face contact today.  Jorge Ny, M.D. [facility] Office: [phone] Pager:  [phone]

## 2012-11-12 ENCOUNTER — Ambulatory Visit: Payer: BC Managed Care – PPO | Admitting: Internal Medicine

## 2012-11-18 ENCOUNTER — Ambulatory Visit (INDEPENDENT_AMBULATORY_CARE_PROVIDER_SITE_OTHER): Payer: BC Managed Care – PPO | Admitting: Internal Medicine

## 2012-11-18 ENCOUNTER — Encounter: Payer: Self-pay | Admitting: Internal Medicine

## 2012-11-18 ENCOUNTER — Other Ambulatory Visit (INDEPENDENT_AMBULATORY_CARE_PROVIDER_SITE_OTHER): Payer: BC Managed Care – PPO

## 2012-11-18 VITALS — BP 112/76 | HR 80 | Temp 98.2°F | Resp 16 | Wt 217.0 lb

## 2012-11-18 DIAGNOSIS — I7092 Chronic total occlusion of artery of the extremities: Secondary | ICD-10-CM

## 2012-11-18 DIAGNOSIS — R3129 Other microscopic hematuria: Secondary | ICD-10-CM

## 2012-11-18 DIAGNOSIS — I723 Aneurysm of iliac artery: Secondary | ICD-10-CM

## 2012-11-18 DIAGNOSIS — E785 Hyperlipidemia, unspecified: Secondary | ICD-10-CM

## 2012-11-18 DIAGNOSIS — I739 Peripheral vascular disease, unspecified: Secondary | ICD-10-CM

## 2012-11-18 LAB — COMPREHENSIVE METABOLIC PANEL
ALT: 20 U/L (ref 0–53)
AST: 18 U/L (ref 0–37)
Albumin: 4 g/dL (ref 3.5–5.2)
Calcium: 9 mg/dL (ref 8.4–10.5)
Chloride: 105 mEq/L (ref 96–112)
Potassium: 3.7 mEq/L (ref 3.5–5.1)
Sodium: 140 mEq/L (ref 135–145)
Total Protein: 6.9 g/dL (ref 6.0–8.3)

## 2012-11-18 LAB — URINALYSIS, ROUTINE W REFLEX MICROSCOPIC
Nitrite: NEGATIVE
Specific Gravity, Urine: >=1.03 (ref 1.000–1.030)
Total Protein, Urine: NEGATIVE
pH: 5.5 (ref 5.0–8.0)

## 2012-11-18 LAB — LIPID PANEL: HDL: 29.1 mg/dL — ABNORMAL LOW (ref >39.00)

## 2012-11-18 LAB — LDL CHOLESTEROL, DIRECT: Direct LDL: 101.9 mg/dL

## 2012-11-18 MED ORDER — ASPIRIN 81 MG PO TBEC: 81.0000 mg | DELAYED_RELEASE_TABLET | Freq: Every day | ORAL | Status: DC

## 2012-11-18 MED ORDER — ROSUVASTATIN CALCIUM 10 MG PO TABS: 10.0000 mg | ORAL_TABLET | Freq: Every day | ORAL | Status: DC

## 2012-11-18 NOTE — Patient Instructions (Signed)

## 2012-11-18 NOTE — Progress Notes (Signed)
Subjective:    Patient ID: Rodney Williams, male    DOB: [birth year], 52 y.o.   MRN: [id number]  Hyperlipidemia This is a new problem. This is a new diagnosis. The problem is uncontrolled. Recent lipid tests were reviewed and are variable. He has no history of chronic renal disease, diabetes, hypothyroidism, liver disease, obesity or nephrotic syndrome. Factors aggravating his hyperlipidemia include fatty foods and smoking. Pertinent negatives include no chest pain, focal sensory loss, focal weakness, leg pain, myalgias or shortness of breath. He is currently on no antihyperlipidemic treatment. Compliance problems include adherence to exercise and adherence to diet.       Review of Systems  Constitutional: Negative.   HENT: Negative.   Eyes: Negative.   Respiratory: Negative.  Negative for shortness of breath.   Cardiovascular: Negative.  Negative for chest pain.  Gastrointestinal: Negative.   Endocrine: Negative.   Genitourinary: Negative.  Negative for dysuria, urgency, frequency, hematuria, decreased urine volume, penile swelling, scrotal swelling, enuresis and difficulty urinating.  Musculoskeletal: Negative.  Negative for myalgias.  Skin: Negative.   Allergic/Immunologic: Negative.   Neurological: Negative.  Negative for focal weakness.  Hematological: Negative.   Psychiatric/Behavioral: Negative.        Objective:   Physical Exam  Vitals reviewed. Constitutional: He is oriented to person, place, and time. He appears well-developed and well-nourished. No distress.  HENT:  Head: Normocephalic and atraumatic.  Mouth/Throat: Oropharynx is clear and moist. No oropharyngeal exudate.  Eyes: Conjunctivae are normal. Right eye exhibits no discharge. Left eye exhibits no discharge. No scleral icterus.  Neck: Normal range of motion. Neck supple. No JVD present. No tracheal deviation present. No thyromegaly present.  Cardiovascular: Normal rate, regular rhythm, normal heart sounds and  intact distal pulses.  Exam reveals no gallop and no friction rub.   No murmur heard. Pulses:      Carotid pulses are 1+ on the right side, and 1+ on the left side.      Radial pulses are 1+ on the right side, and 1+ on the left side.       Femoral pulses are 1+ on the right side, and 1+ on the left side.      Popliteal pulses are 1+ on the right side, and 1+ on the left side.       Dorsalis pedis pulses are 1+ on the right side, and 1+ on the left side.       Posterior tibial pulses are 1+ on the right side, and 1+ on the left side.  Pulmonary/Chest: Effort normal and breath sounds normal. No stridor. No respiratory distress. He has no wheezes. He has no rales. He exhibits no tenderness.  Abdominal: Soft. Bowel sounds are normal. He exhibits no distension and no mass. There is no tenderness. There is no rebound and no guarding.  Musculoskeletal: Normal range of motion. He exhibits no edema and no tenderness.  Lymphadenopathy:    He has no cervical adenopathy.  Neurological: He is oriented to person, place, and time.  Skin: Skin is warm and dry. No rash noted. He is not diaphoretic. No erythema. No pallor.  Psychiatric: He has a normal mood and affect. His behavior is normal. Judgment and thought content normal.    Lab Results  Component Value Date   WBC 6.6 11/23/2011   HGB 15.6 11/23/2011   HCT 46.5 11/23/2011   PLT 178.0 11/23/2011   GLUCOSE 67* 11/23/2011   CHOL 171 11/23/2011   TRIG 272.0* 11/23/2011  HDL 30.60* 11/23/2011   LDLDIRECT 112.4 11/23/2011   ALT 16 11/23/2011   AST 14 11/23/2011   NA 141 11/23/2011   K 4.2 11/23/2011   CL 101 11/23/2011   CREATININE 1.2 11/23/2011   BUN 18 11/23/2011   CO2 31 11/23/2011   TSH 2.60 11/23/2011   PSA 0.69 11/23/2011   HGBA1C 5.8 11/23/2011        Assessment & Plan:

## 2012-11-19 ENCOUNTER — Other Ambulatory Visit: Payer: Self-pay | Admitting: Internal Medicine

## 2012-11-19 ENCOUNTER — Telehealth: Payer: Self-pay

## 2012-11-19 ENCOUNTER — Encounter: Payer: Self-pay | Admitting: Internal Medicine

## 2012-11-19 DIAGNOSIS — R3129 Other microscopic hematuria: Secondary | ICD-10-CM

## 2012-11-19 NOTE — Assessment & Plan Note (Signed)
I will recheck his UA today 

## 2012-11-19 NOTE — Assessment & Plan Note (Signed)
His vascular surgeon has requested that he start a statin

## 2012-11-19 NOTE — Assessment & Plan Note (Signed)
He will start crestor for risk reduction 

## 2012-11-19 NOTE — Telephone Encounter (Signed)
Received fax from pharmacy stating that insurance will not cover Crestor  w/o a prior auth. Need to  call  [phone] [employer]  to proceed with request (ID# [id number] ). PA form  Completed via covermymeds website, and approval pending insurance.

## 2012-11-26 ENCOUNTER — Telehealth: Payer: Self-pay

## 2012-11-26 DIAGNOSIS — E785 Hyperlipidemia, unspecified: Secondary | ICD-10-CM

## 2012-11-26 DIAGNOSIS — I723 Aneurysm of iliac artery: Secondary | ICD-10-CM

## 2012-11-26 MED ORDER — ATORVASTATIN CALCIUM 40 MG PO TABS: 40.0000 mg | ORAL_TABLET | Freq: Every day | ORAL | Status: DC

## 2012-11-26 NOTE — Telephone Encounter (Signed)
Insurance will not cover Crestor without PA. PA request submitted x 2 on May 20,2014 with no reply or status. Preferred medications are simvastatin, lovastatin, atorvastatin, fluvastatin.

## 2012-11-26 NOTE — Telephone Encounter (Signed)
changed

## 2012-11-27 NOTE — Telephone Encounter (Signed)
Medication changed to preferred and pharmacy notified

## 2013-08-01 ENCOUNTER — Other Ambulatory Visit: Payer: Self-pay | Admitting: Surgery

## 2013-08-01 DIAGNOSIS — I6529 Occlusion and stenosis of unspecified carotid artery: Secondary | ICD-10-CM

## 2013-09-09 ENCOUNTER — Ambulatory Visit: Payer: BC Managed Care – PPO | Admitting: Physician Assistant

## 2013-09-09 ENCOUNTER — Ambulatory Visit (INDEPENDENT_AMBULATORY_CARE_PROVIDER_SITE_OTHER)
Admission: RE | Admit: 2013-09-09 | Discharge: 2013-09-09 | Disposition: A | Discharge status: Home / Self Care | Payer: BC Managed Care – PPO | Source: Ambulatory Visit | Attending: Internal Medicine | Admitting: Internal Medicine

## 2013-09-09 ENCOUNTER — Encounter: Payer: Self-pay | Admitting: Internal Medicine

## 2013-09-09 ENCOUNTER — Ambulatory Visit (INDEPENDENT_AMBULATORY_CARE_PROVIDER_SITE_OTHER): Payer: BC Managed Care – PPO | Admitting: Internal Medicine

## 2013-09-09 VITALS — BP 120/98 | HR 80 | Temp 101.2°F | Resp 16 | Wt 217.0 lb

## 2013-09-09 DIAGNOSIS — J069 Acute upper respiratory infection, unspecified: Secondary | ICD-10-CM

## 2013-09-09 DIAGNOSIS — R05 Cough: Secondary | ICD-10-CM

## 2013-09-09 DIAGNOSIS — R059 Cough, unspecified: Secondary | ICD-10-CM

## 2013-09-09 MED ORDER — PROMETHAZINE-CODEINE 6.25-10 MG/5ML PO SYRP: 5.0000 mL | ORAL_SOLUTION | ORAL | Status: DC | PRN

## 2013-09-09 MED ORDER — LEVOFLOXACIN 500 MG PO TABS: 500.0000 mg | ORAL_TABLET | Freq: Every day | ORAL | Status: DC

## 2013-09-09 NOTE — Progress Notes (Signed)
   Subjective:    Patient ID: Rodney Williams, male    DOB: Nov 22, 1960, 53 y.o.   MRN: [id number]  URI  This is a new problem. The current episode started in the past 7 days. The problem has been waxing and waning. The maximum temperature recorded prior to his arrival was 102 - 102.9 F. The fever has been present for 3 to 4 days. Associated symptoms include coughing, rhinorrhea, a sore throat, swollen glands and wheezing. Pertinent negatives include no chest pain, congestion, diarrhea, nausea, neck pain, rash or sneezing. The treatment provided no relief.      Review of Systems  Constitutional: Negative for appetite change, fatigue and unexpected weight change.  HENT: Positive for rhinorrhea and sore throat. Negative for congestion, nosebleeds, sneezing and trouble swallowing.   Eyes: Negative for itching and visual disturbance.  Respiratory: Positive for cough and wheezing.   Cardiovascular: Negative for chest pain, palpitations and leg swelling.  Gastrointestinal: Negative for nausea, diarrhea, blood in stool and abdominal distention.  Genitourinary: Negative for frequency and hematuria.  Musculoskeletal: Negative for back pain, gait problem, joint swelling and neck pain.  Skin: Negative for rash.  Neurological: Negative for dizziness, tremors, speech difficulty and weakness.  Psychiatric/Behavioral: Negative for sleep disturbance, dysphoric mood and agitation. The patient is not nervous/anxious.        Objective:   Physical Exam  Constitutional: He is oriented to person, place, and time. He appears well-developed. No distress.  NAD  HENT:  Mouth/Throat: Oropharynx is clear and moist.  eryth throat  Eyes: Conjunctivae are normal. Pupils are equal, round, and reactive to light.  Neck: Normal range of motion. No JVD present. No thyromegaly present.  Cardiovascular: Normal rate, regular rhythm, normal heart sounds and intact distal pulses.  Exam reveals no gallop and no friction rub.    No murmur heard. Pulmonary/Chest: Effort normal and breath sounds normal. No respiratory distress. He has no wheezes. He has no rales. He exhibits no tenderness.  Abdominal: Soft. Bowel sounds are normal. He exhibits no distension and no mass. There is no tenderness. There is no rebound and no guarding.  Musculoskeletal: Normal range of motion. He exhibits no edema and no tenderness.  Lymphadenopathy:    He has no cervical adenopathy.  Neurological: He is alert and oriented to person, place, and time. He has normal reflexes. No cranial nerve deficit. He exhibits normal muscle tone. He displays a negative Romberg sign. Coordination and gait normal.  No meningeal signs  Skin: Skin is warm and dry. No rash noted.  Psychiatric: He has a normal mood and affect. His behavior is normal. Judgment and thought content normal.          Assessment & Plan:

## 2013-09-09 NOTE — Assessment & Plan Note (Signed)
Prom-cod syr Levaquin x 10 d CXR Stop smoking

## 2013-09-09 NOTE — Progress Notes (Signed)
Pre visit review using our clinic review tool, if applicable. No additional management support is needed unless otherwise documented below in the visit note. 

## 2013-10-14 ENCOUNTER — Other Ambulatory Visit: Payer: Self-pay | Admitting: Surgery

## 2013-10-14 DIAGNOSIS — I723 Aneurysm of iliac artery: Secondary | ICD-10-CM

## 2013-10-31 ENCOUNTER — Encounter: Payer: Self-pay | Admitting: Family

## 2013-11-03 ENCOUNTER — Other Ambulatory Visit (HOSPITAL_COMMUNITY): Payer: BC Managed Care – PPO

## 2013-11-03 ENCOUNTER — Ambulatory Visit: Payer: BC Managed Care – PPO | Admitting: Family

## 2013-11-03 ENCOUNTER — Encounter (HOSPITAL_COMMUNITY): Payer: BC Managed Care – PPO

## 2017-04-09 ENCOUNTER — Telehealth: Payer: Self-pay | Admitting: Internal Medicine

## 2017-04-09 NOTE — Telephone Encounter (Signed)
Patient is requesting to become your patient again. He has not been seen since 2014. Please advise.   Patient is aware Dr.Jones is out of office until Oct 15.

## 2017-04-12 NOTE — Telephone Encounter (Signed)
No VM set up. If patient calls back, please set up a new patient appointment.

## 2017-04-12 NOTE — Telephone Encounter (Signed)
yes

## 2017-05-01 ENCOUNTER — Ambulatory Visit (INDEPENDENT_AMBULATORY_CARE_PROVIDER_SITE_OTHER): Payer: BLUE CROSS/BLUE SHIELD | Admitting: Internal Medicine

## 2017-05-01 ENCOUNTER — Encounter: Payer: Self-pay | Admitting: Internal Medicine

## 2017-05-01 ENCOUNTER — Ambulatory Visit (INDEPENDENT_AMBULATORY_CARE_PROVIDER_SITE_OTHER)
Admission: RE | Admit: 2017-05-01 | Discharge: 2017-05-01 | Disposition: A | Discharge status: Home / Self Care | Payer: BLUE CROSS/BLUE SHIELD | Source: Ambulatory Visit | Attending: Internal Medicine | Admitting: Internal Medicine

## 2017-05-01 VITALS — BP 138/80 | HR 90 | Temp 98.8°F | Resp 16 | Ht 76.0 in | Wt 217.0 lb

## 2017-05-01 DIAGNOSIS — R05 Cough: Secondary | ICD-10-CM

## 2017-05-01 DIAGNOSIS — R059 Cough, unspecified: Secondary | ICD-10-CM

## 2017-05-01 DIAGNOSIS — J449 Chronic obstructive pulmonary disease, unspecified: Secondary | ICD-10-CM | POA: Diagnosis not present

## 2017-05-01 DIAGNOSIS — J4489 Other specified chronic obstructive pulmonary disease: Secondary | ICD-10-CM

## 2017-05-01 MED ORDER — UMECLIDINIUM-VILANTEROL 62.5-25 MCG/INH IN AEPB: 1.0000 | INHALATION_SPRAY | Freq: Every day | RESPIRATORY_TRACT | 1 refills | Status: DC

## 2017-05-01 NOTE — Progress Notes (Signed)
Subjective:  Patient ID: Rodney Williams, male    DOB: 1961/06/15  Age: 56 y.o. MRN: [id number]  CC: Cough   HPI Rodney Williams presents for concerns about a chronic cough.  He has a 40-pack-year history of tobacco abuse.  His cough is rarely productive of phlegm and he denies hemoptysis.  He also complains of mild shortness of breath and wheezing.  He denies chest pain or DOE.  History Rodney Williams has a past medical history of Tobacco abuse.   He has a past surgical history that includes Inguinal hernia repair; Ulnar nerve repair; and ulnar nerve surgery (per pt. 7-8 years ago).   His family history includes Diabetes in his father.He reports that he has been smoking Cigarettes.  He has a 80.00 pack-year smoking history. He has never used smokeless tobacco. He reports that he does not drink alcohol or use drugs.  Outpatient Medications Prior to Visit  Medication Sig Dispense Refill  . aspirin 81 MG EC tablet Take 1 tablet (81 mg total) by mouth daily. Swallow whole. 30 tablet 12  . atorvastatin (LIPITOR) 40 MG tablet Take 1 tablet (40 mg total) by mouth daily. 90 tablet 3  . levofloxacin (LEVAQUIN) 500 MG tablet Take 1 tablet (500 mg total) by mouth daily. 10 tablet 0  . promethazine-codeine (PHENERGAN WITH CODEINE) 6.25-10 MG/5ML syrup Take 5 mLs by mouth every 4 (four) hours as needed for cough. 300 mL 0   No facility-administered medications prior to visit.     ROS Review of Systems  Constitutional: Negative.  Negative for chills, diaphoresis, fatigue and fever.  HENT: Negative.  Negative for sinus pressure and trouble swallowing.   Eyes: Negative for visual disturbance.  Respiratory: Positive for cough, shortness of breath and wheezing. Negative for choking, chest tightness and stridor.   Cardiovascular: Negative.  Negative for chest pain, palpitations and leg swelling.  Gastrointestinal: Negative for abdominal pain, constipation, diarrhea, nausea and vomiting.  Endocrine:  Negative.   Genitourinary: Negative.  Negative for difficulty urinating.  Musculoskeletal: Negative.  Negative for back pain, myalgias and neck pain.  Skin: Negative.  Negative for color change and rash.  Allergic/Immunologic: Negative.   Neurological: Negative.  Negative for dizziness.  Hematological: Negative for adenopathy. Does not bruise/bleed easily.  Psychiatric/Behavioral: Negative.     Objective:  BP 138/80 (BP Location: Left Arm, Patient Position: Sitting, Cuff Size: Normal)   Pulse 90   Temp 98.8 F (37.1 C) (Oral)   Ht 6\' 4"  (1.93 m)   Wt 217 lb (98.4 kg)   SpO2 95%   BMI 26.41 kg/m   Physical Exam  Constitutional: He is oriented to person, place, and time. No distress.  HENT:  Mouth/Throat: Oropharynx is clear and moist. No oropharyngeal exudate.  Eyes: Conjunctivae are normal. Right eye exhibits no discharge. Left eye exhibits no discharge. No scleral icterus.  Neck: Normal range of motion. Neck supple. No JVD present. No thyromegaly present.  Cardiovascular: Normal rate, regular rhythm and normal heart sounds.  Exam reveals no gallop and no friction rub.   No murmur heard. Pulmonary/Chest: Effort normal. No accessory muscle usage. No tachypnea. No respiratory distress. He has decreased breath sounds in the right upper field, the right middle field, the right lower field, the left upper field, the left middle field and the left lower field. He has no wheezes. He has no rhonchi. He has no rales. He exhibits no tenderness.  Abdominal: Soft. Bowel sounds are normal. He exhibits no distension and  no mass. There is no tenderness. There is no rebound and no guarding.  Musculoskeletal: Normal range of motion. He exhibits no edema, tenderness or deformity.  Neurological: He is alert and oriented to person, place, and time.  Skin: Skin is warm and dry. No rash noted. He is not diaphoretic. No erythema. No pallor.  Vitals reviewed.   Lab Results  Component Value Date   WBC  6.6 11/23/2011   HGB 15.6 11/23/2011   HCT 46.5 11/23/2011   PLT 178.0 11/23/2011   GLUCOSE 84 11/18/2012   CHOL 174 11/18/2012   TRIG 294.0 (H) 11/18/2012   HDL 29.10 (L) 11/18/2012   LDLDIRECT 101.9 11/18/2012   ALT 20 11/18/2012   AST 18 11/18/2012   NA 140 11/18/2012   K 3.7 11/18/2012   CL 105 11/18/2012   CREATININE 1.3 11/18/2012   BUN 14 11/18/2012   CO2 28 11/18/2012   TSH 2.60 11/23/2011   PSA 0.69 11/23/2011   HGBA1C 5.8 11/23/2011    Dg Chest 2 View  Result Date: 05/02/2017 CLINICAL DATA:  Cough, history of tobacco use EXAM: CHEST  2 VIEW COMPARISON:  09/09/2013 FINDINGS: Cardiac shadow is within normal limits. The lungs are mildly hyperinflated. No focal infiltrate or sizable effusion is seen. No acute bony abnormality is noted. IMPRESSION: COPD without acute abnormality. Electronically Signed   By: Alcide CleverMark  Lukens M.D.   On: 05/02/2017 08:49    Assessment & Plan:   Rodney Williams was seen today for cough.  Diagnoses and all orders for this visit:  Cough- CXR is + for COPD, neg for mass or PNA -     DG Chest 2 View; Future -     Pulmonary Function Test; Future  COPD (chronic obstructive pulmonary disease) with chronic bronchitis (HCC)- I have asked him to quit smoking.  Since he is symptomatic we will start treating his symptoms with a LAMA/LABA inhaler combination.  I gave him a sample of ANORO and showed him how to use it.  He demonstrated proficiency with its use.  Chest x-ray confirms COPD.  I have asked him to undergo PFTs to see if he will respond to the long-acting beta antagonist antagonist., to screen for asthma, and to confirm the dx of COPD. -     umeclidinium-vilanterol (ANORO ELLIPTA) 62.5-25 MCG/INH AEPB; Inhale 1 puff into the lungs daily. -     Pulmonary Function Test; Future   I have discontinued Rodney Williams's aspirin, atorvastatin, promethazine-codeine, and levofloxacin. I am also having him start on umeclidinium-vilanterol.  Meds ordered this encounter   Medications  . umeclidinium-vilanterol (ANORO ELLIPTA) 62.5-25 MCG/INH AEPB    Sig: Inhale 1 puff into the lungs daily.    Dispense:  90 each    Refill:  1     Follow-up: No Follow-up on file.  Sanda Lingerhomas Lacorey Brusca, MD

## 2017-05-01 NOTE — Patient Instructions (Signed)
Cough, Adult Coughing is a reflex that clears your throat and your airways. Coughing helps to heal and protect your lungs. It is normal to cough occasionally, but a cough that happens with other symptoms or lasts a long time may be a sign of a condition that needs treatment. A cough may last only 2-3 weeks (acute), or it may last longer than 8 weeks (chronic). What are the causes? Coughing is commonly caused by:  Breathing in substances that irritate your lungs.  A viral or bacterial respiratory infection.  Allergies.  Asthma.  Postnasal drip.  Smoking.  Acid backing up from the stomach into the esophagus (gastroesophageal reflux).  Certain medicines.  Chronic lung problems, including COPD (or rarely, lung cancer).  Other medical conditions such as heart failure.  Follow these instructions at home: Pay attention to any changes in your symptoms. Take these actions to help with your discomfort:  Take medicines only as told by your health care provider. ? If you were prescribed an antibiotic medicine, take it as told by your health care provider. Do not stop taking the antibiotic even if you start to feel better. ? Talk with your health care provider before you take a cough suppressant medicine.  Drink enough fluid to keep your urine clear or pale yellow.  If the air is dry, use a cold steam vaporizer or humidifier in your bedroom or your home to help loosen secretions.  Avoid anything that causes you to cough at work or at home.  If your cough is worse at night, try sleeping in a semi-upright position.  Avoid cigarette smoke. If you smoke, quit smoking. If you need help quitting, ask your health care provider.  Avoid caffeine.  Avoid alcohol.  Rest as needed.  Contact a health care provider if:  You have new symptoms.  You cough up pus.  Your cough does not get better after 2-3 weeks, or your cough gets worse.  You cannot control your cough with suppressant  medicines and you are losing sleep.  You develop pain that is getting worse or pain that is not controlled with pain medicines.  You have a fever.  You have unexplained weight loss.  You have night sweats. Get help right away if:  You cough up blood.  You have difficulty breathing.  Your heartbeat is very fast. This information is not intended to replace advice given to you by your health care provider. Make sure you discuss any questions you have with your health care provider. Document Released: 12/16/2010 Document Revised: 11/25/2015 Document Reviewed: 08/26/2014 Elsevier Interactive Patient Education  2017 [employer]  

## 2017-05-04 ENCOUNTER — Encounter: Payer: Self-pay | Admitting: Internal Medicine

## 2017-06-13 ENCOUNTER — Ambulatory Visit: Payer: BLUE CROSS/BLUE SHIELD | Admitting: Internal Medicine

## 2020-10-11 ENCOUNTER — Encounter: Payer: Self-pay | Admitting: Internal Medicine

## 2020-10-11 ENCOUNTER — Other Ambulatory Visit: Payer: Self-pay

## 2020-10-11 ENCOUNTER — Ambulatory Visit (INDEPENDENT_AMBULATORY_CARE_PROVIDER_SITE_OTHER): Payer: 59 | Admitting: Internal Medicine

## 2020-10-11 VITALS — BP 128/78 | HR 89 | Temp 97.9°F | Ht 76.0 in | Wt 212.4 lb

## 2020-10-11 DIAGNOSIS — M7021 Olecranon bursitis, right elbow: Secondary | ICD-10-CM | POA: Diagnosis not present

## 2020-10-11 DIAGNOSIS — J4489 Other specified chronic obstructive pulmonary disease: Secondary | ICD-10-CM

## 2020-10-11 DIAGNOSIS — J449 Chronic obstructive pulmonary disease, unspecified: Secondary | ICD-10-CM | POA: Diagnosis not present

## 2020-10-11 DIAGNOSIS — Z720 Tobacco use: Secondary | ICD-10-CM

## 2020-10-11 MED ORDER — TRELEGY ELLIPTA 100-62.5-25 MCG/INH IN AEPB: 1.0000 | INHALATION_SPRAY | Freq: Every day | RESPIRATORY_TRACT | 0 refills | Status: DC

## 2020-10-11 MED ORDER — METHYLPREDNISOLONE ACETATE 40 MG/ML IJ SUSP
40.0000 mg | Freq: Once | INTRAMUSCULAR | Status: AC
  Administered 2020-10-11: 40 mg via INTRAMUSCULAR

## 2020-10-11 NOTE — Progress Notes (Signed)
Subjective:  Patient ID: Rodney Williams, male    DOB: Jan 07, 1961  Age: 60 y.o. MRN: [id number]  CC: Office Visit (Knot on right elbow that is tender to touch. Per patient difficult to grip things in right hand due to knot. "Soft and mushy". Knot appeared Wednesday night 10/06/20. Patient states that he does remember hitting his elbow sometime during the day Wednesday.)  This visit occurred during the SARS-CoV-2 public health emergency.  Safety protocols were in place, including screening questions prior to the visit, additional usage of staff PPE, and extensive cleaning of exam room while observing appropriate contact time as indicated for disinfecting solutions.    HPI Rodney Williams presents for concerns about his right elbow - He sustained a mild/glancing injury 5 days ago and complains of pain/swelling over the tip of the elbow but says that his ROM is good and he is not taking anything for pain. He has COPD and has been using a friend's supply of trelegy with good results. He has mild wheezing and SOB but denies CP, DOE, diaphoresis.  History Rodney Williams has a past medical history of Tobacco abuse.   He has a past surgical history that includes Inguinal hernia repair; Ulnar nerve repair; and ulnar nerve surgery (per pt. 7-8 years ago).   His family history includes Diabetes in his father.He reports that he has been smoking cigarettes. He has a 80.00 pack-year smoking history. He has never used smokeless tobacco. He reports that he does not drink alcohol and does not use drugs.  Outpatient Medications Prior to Visit  Medication Sig Dispense Refill  . umeclidinium-vilanterol (ANORO ELLIPTA) 62.5-25 MCG/INH AEPB Inhale 1 puff into the lungs daily. (Patient not taking: Reported on 10/11/2020) 90 each 1   No facility-administered medications prior to visit.    ROS Review of Systems  Constitutional: Negative for chills, diaphoresis, fatigue and fever.  HENT: Negative.   Eyes: Negative  for visual disturbance.  Respiratory: Positive for cough, shortness of breath and wheezing. Negative for choking and stridor.   Cardiovascular: Negative for chest pain, palpitations and leg swelling.  Gastrointestinal: Negative for abdominal pain, constipation, diarrhea, nausea and vomiting.  Genitourinary: Negative.  Negative for difficulty urinating.  Musculoskeletal: Negative.  Negative for arthralgias and myalgias.  Skin: Negative.  Negative for color change.  Neurological: Negative.  Negative for dizziness, weakness and light-headedness.  Hematological: Negative for adenopathy. Does not bruise/bleed easily.  Psychiatric/Behavioral: Negative.     Objective:  BP 128/78 (BP Location: Left Arm, Patient Position: Sitting, Cuff Size: Large)   Pulse 89   Temp 97.9 F (36.6 C) (Oral)   Ht 6\' 4"  (1.93 m)   Wt 212 lb 6.4 oz (96.3 kg)   SpO2 96%   BMI 25.85 kg/m   Physical Exam Vitals reviewed.  Constitutional:      General: He is not in acute distress.    Appearance: He is not ill-appearing, toxic-appearing or diaphoretic.  Eyes:     General: No scleral icterus.    Conjunctiva/sclera: Conjunctivae normal.  Cardiovascular:     Rate and Rhythm: Normal rate and regular rhythm.  Pulmonary:     Effort: Pulmonary effort is normal. No accessory muscle usage or respiratory distress.     Breath sounds: Normal air entry. Examination of the right-upper field reveals decreased breath sounds. Examination of the left-upper field reveals decreased breath sounds. Examination of the right-middle field reveals decreased breath sounds. Examination of the left-middle field reveals decreased breath sounds. Examination  of the right-lower field reveals decreased breath sounds. Examination of the left-lower field reveals decreased breath sounds. Decreased breath sounds present. No wheezing, rhonchi or rales.  Musculoskeletal:        General: Swelling and tenderness present. No signs of injury. Normal range  of motion.     Right elbow: Swelling and effusion present. No deformity. Normal range of motion. Tenderness present in olecranon process.     Left elbow: Normal.     Cervical back: Neck supple.  Lymphadenopathy:     Cervical: No cervical adenopathy.  Skin:    General: Skin is dry.  Neurological:     General: No focal deficit present.     Mental Status: He is alert.     Lab Results  Component Value Date   WBC 6.6 11/23/2011   HGB 15.6 11/23/2011   HCT 46.5 11/23/2011   PLT 178.0 11/23/2011   GLUCOSE 84 11/18/2012   CHOL 174 11/18/2012   TRIG 294.0 (H) 11/18/2012   HDL 29.10 (L) 11/18/2012   LDLDIRECT 101.9 11/18/2012   ALT 20 11/18/2012   AST 18 11/18/2012   NA 140 11/18/2012   K 3.7 11/18/2012   CL 105 11/18/2012   CREATININE 1.3 11/18/2012   BUN 14 11/18/2012   CO2 28 11/18/2012   TSH 2.60 11/23/2011   PSA 0.69 11/23/2011   HGBA1C 5.8 11/23/2011    After informed verbal consent was obtained, using sterile technique the right posterior elbow was prepped with betadine and 2% Lidocaine with epi was used as local anesthetic. The olecranon bursa was entered and 5 ml's of bloody fluid (see photo) was withdrawn and sent for culture, cell ct with diff, and crystals.  Steroid 40 mg of depo-medrol and 1 ml plain Lidocaine was then injected and the needle withdrawn.  The procedure was well tolerated.  The patient is asked to continue to rest the joint for one week before resuming regular activities.      Component 4 d ago  Source: RT OLECRANON BURSA   Status: FINAL   Gram Stain: Few epithelial cells Few Polymorphonuclear leukocytes No organisms seen   RESULT: No Growth   Resulting Agency [facility]     SYNOVIAL   Color, Synovial STRAW/YELL REDAbnormal   Appearance-Synovial CLEAR/HAZY BLOODYAbnormal   WBC, Synovial <150 cells/uL 974High   Neutrophil, Synovial 0 - 24 % 10   Lymphocytes-Synovial Fld 0 - 74 % 48   Monocyte/Macrophage 0 - 69 % 42   Eosinophils-Synovial 0 - 2  % 0   Basophils, % 0 % 0   Synoviocytes, % 0 - 15 % 0   Resulting        Assessment & Plan:   Ion was seen today for office visit.  Diagnoses and all orders for this visit:  Olecranon bursitis of right elbow- The culture is negative and the cell count is not suspicious for infection.  Examination for crystals is pending.  This is likely a traumatic bursitis.  The steroid injection should help.  An Ace wrap was placed.  He was asked to rest the right upper extremity for the next week. -     WOUND CULTURE; Future -     Cell count + diff,  w/ cryst-synvl fld; Future -     Cell count + diff,  w/ cryst-synvl fld -     WOUND CULTURE -     methylPREDNISolone acetate (DEPO-MEDROL) injection 40 mg  COPD (chronic obstructive pulmonary disease) with chronic bronchitis (HCC)- I  recommended that he obtain his own supply of Trelegy. -     Fluticasone-Umeclidin-Vilant (TRELEGY ELLIPTA) 100-62.5-25 MCG/INH AEPB; Inhale 1 puff into the lungs daily.  Tobacco abuse -     Ambulatory Referral for Lung Cancer Scre  Other orders -     Cell Count + Diff, w/o Cryst, Synvl Fld. -     TIQ-NTM   I have discontinued Alveda Reasons "BILLY"'s umeclidinium-vilanterol. I am also having him start on Trelegy Ellipta. We administered methylPREDNISolone acetate.  Meds ordered this encounter  Medications  . Fluticasone-Umeclidin-Vilant (TRELEGY ELLIPTA) 100-62.5-25 MCG/INH AEPB    Sig: Inhale 1 puff into the lungs daily.    Dispense:  3 each    Refill:  0  . methylPREDNISolone acetate (DEPO-MEDROL) injection 40 mg     Follow-up: Return in about 1 week (around 10/18/2020).  Sanda Linger, MD

## 2020-10-11 NOTE — Patient Instructions (Signed)
Elbow Bursitis Rehab Ask your health care provider which exercises are safe for you. Do exercises exactly as told by your health care provider and adjust them as directed. It is normal to feel mild stretching, pulling, tightness, or discomfort as you do these exercises. Stop right away if you feel sudden pain or your pain gets worse. Do not begin these exercises until told by your health care provider. Stretching and range-of-motion exercises These exercises warm up your muscles and joints and improve the movement and flexibility of your elbow. The exercises also help to relieve pain and swelling. Elbow flexion, assisted 1. Stand or sit with your left / right arm at your side. 2. Use your other hand to gently push your left / right hand toward your shoulder (assisted) while bending your elbow (flexion). 3. Hold this position for __________ seconds. 4. Slowly return your left / right arm to the starting position. Repeat __________ times. Complete this exercise __________ times a day. Elbow extension, assisted 1. Lie on your back in a comfortable position that allows you to relax your arm muscles. 2. Place a folded towel under your left / right upper arm so that your elbow and shoulder are at the same height. 3. Use your other arm to raise your left / right arm (assisted) until your elbow and hand do not rest on the bed or towel. Hold your left / right arm out straight with your other hand supporting it. 4. Let the weight of your hand straighten your elbow (extension). You should feel a stretch on the inside of your elbow. ? Keep your arm and chest muscles relaxed. ? If directed, add a small wrist weight or hand weight to increase the stretch. 5. Hold this position for __________ seconds. 6. Slowly release the stretch and return to the starting position. Repeat __________ times. Complete this exercise __________ times a day. Elbow flexion, active 1. Stand or sit with your left / right elbow bent  and your palm facing in, toward your body. 2. Bend your elbow as far as you can using only your arm muscles (active flexion). 3. Hold this position for __________ seconds. 4. Slowly return to the starting position. Repeat __________ times. Complete this exercise __________ times a day. Elbow extension, active 1. Stand or sit with your left / right elbow bent and your palm facing in, toward your body. 2. Slowly straighten your elbow using only your arm muscles (active extension). Stop when you feel a gentle stretch at the front of your arm, or when your arm is straight. 3. Hold this position for __________ seconds. 4. Slowly return to the starting position. Repeat __________ times. Complete this exercise __________ times a day. Strengthening exercises These exercises build strength and endurance in your elbow. Endurance is the ability to use your muscles for a long time, even after they get tired. Elbow flexion, isometric 1. Stand or sit with your left / right arm at waist height. Your palm should face in, toward your body. 2. Place your other hand on top of your left / right forearm. Gently push down while you resist with your left / right arm (isometric flexion). ? Use about 50% effort with both arms. You may be instructed to use more and more effort with your arms each week. ? Try not to let your left / right arm move during the exercise. 3. Hold this position for __________ seconds. 4. Let your muscles relax completely before you repeat the exercise. Repeat __________ times.   Complete this exercise __________ times a day. Elbow extension, isometric 1. Stand or sit with your left / right arm at waist height. Your palm should face in, toward your body. 2. Place your other hand on the bottom of your left / right forearm. Gently push up while you resist with your left / right arm (isometric extension). ? Use about 50% effort with both arms. You may be instructed to use more and more effort with  your arms each week. ? Try not to let your left / right arm move during the exercise. 3. Hold this position for __________ seconds. 4. Let your muscles relax completely before you repeat the exercise. Repeat __________ times. Complete this exercise __________ times a day.   Biceps curls 1. Sit on a stable chair without armrests, or stand up. 2. Hold a _________ weight in your left / right hand. Your palm should face out, away from your body, at the starting position. 3. Bend your left / right elbow and move your hand up toward your shoulder. Keep your elbow at your side while you bend it. 4. Slowly return to the starting position. Repeat __________ times. Complete this exercise __________ times a day. Triceps curls 1. Lie on your back. 2. Hold a _________ weight in your left / right hand. 3. Bend your left / right elbow to a 90-degree angle (right angle), so the weight is in front of your face, over your chest, and your elbow is pointed up to the ceiling. 4. Straighten your elbow, raising your hand toward the ceiling. Use your other hand to support your left / right upper arm and to keep it still. 5. Slowly return to the starting position. Repeat __________ times. Complete this exercise __________ times a day.   This information is not intended to replace advice given to you by your health care provider. Make sure you discuss any questions you have with your health care provider. Document Revised: 10/10/2018 Document Reviewed: 07/10/2018 Elsevier Patient Education  2021 [employer]  

## 2020-10-15 DIAGNOSIS — Z720 Tobacco use: Secondary | ICD-10-CM | POA: Insufficient documentation

## 2020-10-15 LAB — WOUND CULTURE: RESULT:: NO GROWTH

## 2020-10-15 LAB — CELL COUNT + DIFF, W/O CRYST-SYNVL FLD
Basophils, %: 0 %
Eosinophils-Synovial: 0 % (ref 0–2)
Lymphocytes-Synovial Fld: 48 % (ref 0–74)
Monocyte/Macrophage: 42 % (ref 0–69)
Neutrophil, Synovial: 10 % (ref 0–24)
Synoviocytes, %: 0 % (ref 0–15)
WBC, Synovial: 974 cells/uL — ABNORMAL HIGH (ref <150)

## 2020-10-15 LAB — TIQ-NTM

## 2020-10-19 ENCOUNTER — Other Ambulatory Visit: Payer: Self-pay | Admitting: *Deleted

## 2020-10-19 DIAGNOSIS — F17210 Nicotine dependence, cigarettes, uncomplicated: Secondary | ICD-10-CM

## 2020-10-21 ENCOUNTER — Encounter: Payer: Self-pay | Admitting: Internal Medicine

## 2020-10-21 ENCOUNTER — Other Ambulatory Visit: Payer: Self-pay

## 2020-10-21 ENCOUNTER — Ambulatory Visit (INDEPENDENT_AMBULATORY_CARE_PROVIDER_SITE_OTHER): Payer: 59 | Admitting: Internal Medicine

## 2020-10-21 VITALS — BP 124/78 | HR 85 | Temp 98.1°F | Ht 76.0 in | Wt 210.0 lb

## 2020-10-21 DIAGNOSIS — Z0001 Encounter for general adult medical examination with abnormal findings: Secondary | ICD-10-CM

## 2020-10-21 DIAGNOSIS — M7021 Olecranon bursitis, right elbow: Secondary | ICD-10-CM

## 2020-10-21 NOTE — Progress Notes (Signed)
   Subjective:  Patient ID: ARBOR COHEN, male    DOB: [birth year]  Age: 60 y.o. MRN: [id number]  CC: Follow-up  This visit occurred during the SARS-CoV-2 public health emergency.  Safety protocols were in place, including screening questions prior to the visit, additional usage of staff PPE, and extensive cleaning of exam room while observing appropriate contact time as indicated for disinfecting solutions.    HPI VICTORIOUS KUNDINGER presents for f/up -  He returns for follow-up on right olecranon bursitis.  He tells me the pain and swelling has resolved.  He has normal range of motion in the elbow.  He is not willing to undergo physical today.  The fluid was negative for crystals or infection.  Outpatient Medications Prior to Visit  Medication Sig Dispense Refill  . Fluticasone-Umeclidin-Vilant (TRELEGY ELLIPTA) 100-62.5-25 MCG/INH AEPB Inhale 1 puff into the lungs daily. 3 each 0   No facility-administered medications prior to visit.    ROS Review of Systems  All other systems reviewed and are negative.   Objective:  BP 124/78   Pulse 85   Temp 98.1 F (36.7 C) (Oral)   Ht 6\' 4"  (1.93 m)   Wt 210 lb (95.3 kg)   SpO2 94%   BMI 25.56 kg/m   BP Readings from Last 3 Encounters:  10/21/20 124/78  10/11/20 128/78  05/01/17 138/80    Wt Readings from Last 3 Encounters:  10/21/20 210 lb (95.3 kg)  10/11/20 212 lb 6.4 oz (96.3 kg)  05/01/17 217 lb (98.4 kg)    Physical Exam Musculoskeletal:     Right elbow: Normal. No swelling or deformity. Normal range of motion. No tenderness.     Lab Results  Component Value Date   WBC 6.6 11/23/2011   HGB 15.6 11/23/2011   HCT 46.5 11/23/2011   PLT 178.0 11/23/2011   GLUCOSE 84 11/18/2012   CHOL 174 11/18/2012   TRIG 294.0 (H) 11/18/2012   HDL 29.10 (L) 11/18/2012   LDLDIRECT 101.9 11/18/2012   ALT 20 11/18/2012   AST 18 11/18/2012   NA 140 11/18/2012   K 3.7 11/18/2012   CL 105 11/18/2012   CREATININE 1.3 11/18/2012    BUN 14 11/18/2012   CO2 28 11/18/2012   TSH 2.60 11/23/2011   PSA 0.69 11/23/2011   HGBA1C 5.8 11/23/2011    DG Chest 2 View  Result Date: 05/02/2017 CLINICAL DATA:  Cough, history of tobacco use EXAM: CHEST  2 VIEW COMPARISON:  09/09/2013 FINDINGS: Cardiac shadow is within normal limits. The lungs are mildly hyperinflated. No focal infiltrate or sizable effusion is seen. No acute bony abnormality is noted. IMPRESSION: COPD without acute abnormality. Electronically Signed   By: 11/09/2013 M.D.   On: 05/02/2017 08:49    Assessment & Plan:   Djon was seen today for follow-up.  Diagnoses and all orders for this visit:  Encounter for general adult medical examination with abnormal findings  Olecranon bursitis of right elbow- This was likely traumatic in nature.  It has resolved.  I have asked him return soon for a CPX.   I am having Chrissie Noa "BILLY" maintain his Trelegy Ellipta.  No orders of the defined types were placed in this encounter.    Follow-up: Return in about 3 weeks (around 11/11/2020).  01/11/2021, MD

## 2020-10-22 ENCOUNTER — Encounter: Payer: Self-pay | Admitting: Internal Medicine

## 2020-10-22 NOTE — Patient Instructions (Signed)
Elbow Bursitis  Bursitis is swelling and pain at the tip of the elbow. This happens when fluid builds up in a sac under the skin (bursa). This may also be called olecranon bursitis. What are the causes? Elbow bursitis may be caused by:  Elbow injury, such as falling onto the elbow.  Leaning on hard surfaces for long periods of time.  Infection from an injury that breaks the skin near the elbow.  A bone growth (spur) that forms at the tip of the elbow.  A medical condition that causes inflammation, such as gout or rheumatoid arthritis. Sometimes the cause is not known. What are the signs or symptoms? The first sign of elbow bursitis is usually swelling at the tip of the elbow. This can grow to be about the size of a golf ball. Swelling may start suddenly or develop gradually. Other symptoms may include:  Pain when bending or leaning on the elbow.  Not being able to move the elbow normally. If bursitis is caused by an infection, you may have:  Redness, warmth, and tenderness of the elbow.  Drainage of pus from the swollen area over the elbow, if the skin breaks open. How is this diagnosed? This condition may be diagnosed based on:  Your symptoms and medical history.  Any recent injuries you have had.  A physical exam.  X-rays to check for a bone spur or fracture.  Draining fluid from the bursa to test it for infection.  Blood tests to rule out gout or rheumatoid arthritis. How is this treated? Treatment for elbow bursitis depends on the cause. Treatment may include:  Medicines. These may include: ? Over-the-counter medicines to relieve pain and inflammation. ? Antibiotic medicines. ? Injections of anti-inflammatory medicines (steroids).  Draining fluid from the bursa.  Wrapping your elbow with a bandage.  Wearing elbow pads. If these treatments do not help, you may need surgery to remove the bursa. Follow these instructions at home: Medicines  Take  over-the-counter and prescription medicines only as told by your health care provider.  If you were prescribed an antibiotic medicine, take it as told by your health care provider. Do not stop taking the antibiotic even if you start to feel better. Managing pain, stiffness, and swelling  If directed, put ice on your elbow: ? Put ice in a plastic bag. ? Place a towel between your skin and the bag. ? Leave the ice on for 20 minutes, 2-3 times a day.  If your bursitis is caused by an injury, rest your elbow and wear your bandage as told by your health care provider.  Use elbow pads or elbow wraps to cushion your elbow as needed.   General instructions  Avoid any activities that cause elbow pain. Ask your health care provider what activities are safe for you.  Keep all follow-up visits as told by your health care provider. This is important. Contact a health care provider if you have:  A fever.  Symptoms that do not get better with treatment.  Pain or swelling that: ? Gets worse. ? Goes away and then comes back.  Pus draining from your elbow. Get help right away if you have:  Trouble moving your arm, hand, or fingers. Summary  Elbow bursitis is inflammation of the fluid-filled sac (bursa) between the tip of your elbow bone (olecranon) and your skin.  Treatment for elbow bursitis depends on the cause. It may include medicines to relieve pain and inflammation, antibiotic medicines, and draining fluid from your   elbow.  Contact a health care provider if your symptoms do not get better with treatment, or if your symptoms go away and then come back. This information is not intended to replace advice given to you by your health care provider. Make sure you discuss any questions you have with your health care provider. Document Revised: 12/24/2019 Document Reviewed: 12/24/2019 Elsevier Patient Education  2021 [employer]  

## 2020-11-19 ENCOUNTER — Ambulatory Visit (INDEPENDENT_AMBULATORY_CARE_PROVIDER_SITE_OTHER): Payer: 59 | Admitting: Primary Care

## 2020-11-19 ENCOUNTER — Encounter: Payer: Self-pay | Admitting: Primary Care

## 2020-11-19 ENCOUNTER — Other Ambulatory Visit: Payer: Self-pay

## 2020-11-19 ENCOUNTER — Ambulatory Visit (INDEPENDENT_AMBULATORY_CARE_PROVIDER_SITE_OTHER)
Admission: RE | Admit: 2020-11-19 | Discharge: 2020-11-19 | Disposition: A | Discharge status: Home / Self Care | Payer: 59 | Source: Ambulatory Visit | Attending: Internal Medicine | Admitting: Internal Medicine

## 2020-11-19 VITALS — BP 136/88 | HR 97 | Temp 98.2°F | Ht 76.0 in | Wt 215.8 lb

## 2020-11-19 DIAGNOSIS — F17200 Nicotine dependence, unspecified, uncomplicated: Secondary | ICD-10-CM

## 2020-11-19 DIAGNOSIS — F17210 Nicotine dependence, cigarettes, uncomplicated: Secondary | ICD-10-CM

## 2020-11-19 NOTE — Progress Notes (Signed)
Shared Decision Making Visit Lung Cancer Screening Program [phone])   Eligibility:  Age 60 y.o.  Pack Years Smoking History Calculation : 43 (# packs/per year x # years smoked)  Recent History of coughing up blood  no  Unexplained weight loss? no ( >Than 15 pounds within the last 6 months )  Prior History Lung / other cancer no (Diagnosis within the last 5 years already requiring surveillance chest CT Scans).  Smoking Status Current Smoker  Former Smokers: Years since quit: NA  Quit Date: NA  Visit Components:  Discussion included one or more decision making aids. yes  Discussion included risk/benefits of screening. yes  Discussion included potential follow up diagnostic testing for abnormal scans. yes  Discussion included meaning and risk of over diagnosis. yes  Discussion included meaning and risk of False Positives. yes  Discussion included meaning of total radiation exposure. yes  Counseling Included:  Importance of adherence to annual lung cancer LDCT screening. yes  Impact of comorbidities on ability to participate in the program. yes  Ability and willingness to under diagnostic treatment. yes  Smoking Cessation Counseling:  Current Smokers:   Discussed importance of smoking cessation. yes  Information about tobacco cessation classes and interventions provided to patient. yes  Patient provided with "ticket" for LDCT Scan. yes  Symptomatic Patient. no  Counseling(Intermediate counseling: > three minutes) [id number]  Diagnosis Code: Tobacco Use Z72.0  Asymptomatic Patient yes  Counseling (Intermediate counseling: > three minutes counseling) [id number]  Former Smokers:   Discussed the importance of maintaining cigarette abstinence. yes  Diagnosis Code: Personal History of Nicotine Dependence. T51.761  Information about tobacco cessation classes and interventions provided to patient. Yes  Patient provided with "ticket" for LDCT Scan. yes  Written Order  for Lung Cancer Screening with LDCT placed in Epic. Yes (CT Chest Lung Cancer Screening Low Dose W/O CM) [id number] Z12.2-Screening of respiratory organs Z87.891-Personal history of nicotine dependence  I have spent 25 minutes of face to face time with Rodney Williams discussing the risks and benefits of lung cancer screening. We viewed a power point together that explained in detail the above noted topics. We paused at intervals to allow for questions to be asked and answered to ensure understanding.We discussed that the single most powerful action that he can take to decrease his risk of developing lung cancer is to quit smoking. We discussed whether or not he is ready to commit to setting a quit date. We discussed options for tools to aid in quitting smoking including nicotine replacement therapy, non-nicotine medications, support groups, Quit Smart classes, and behavior modification. We discussed that often times setting smaller, more achievable goals, such as eliminating 1 cigarette a day for a week and then 2 cigarettes a day for a week can be helpful in slowly decreasing the number of cigarettes smoked. This allows for a sense of accomplishment as well as providing a clinical benefit. I gave him the " Be Stronger Than Your Excuses" card with contact information for community resources, classes, free nicotine replacement therapy, and access to mobile apps, text messaging, and on-line smoking cessation help. I have also given him my card and contact information in the event he needs to contact me. We discussed the time and location of the scan, and that either Abigail Miyamoto RN or I will call with the results within 24-48 hours of receiving them. I have offered him  a copy of the power point we viewed  as a resource in the event they need  reinforcement of the concepts we discussed today in the office. The patient verbalized understanding of all of  the above and had no further questions upon leaving the office. They  have my contact information in the event they have any further questions.  I spent 3-5 minutes counseling on smoking cessation and the health risks of continued tobacco abuse.  I explained to the patient that there has been a high incidence of coronary artery disease noted on these exams. I explained that this is a non-gated exam therefore degree or severity cannot be determined. This patient is not on statin therapy. I have asked the patient to follow-up with their PCP regarding any incidental finding of coronary artery disease and management with diet or medication as their PCP  feels is clinically indicated. The patient verbalized understanding of the above and had no further questions upon completion of the visit.  Patient refused to wear mask at beginning of appointment, eventually put mask on. Advised he will need to wear during his CT scan. He is not ready to quit smoking. He reports right elbow pain and swelling, this is not new and he will follow up with PCP. Advised RICE.    Rodney Bayley, NP

## 2020-11-19 NOTE — Patient Instructions (Signed)
Thank you for participating in the [facility]. It was our pleasure to meet you today. We will call you with the results of your scan within the next few days. Your scan will be assigned a Lung RADS category score by the physicians reading the scans.  This Lung RADS score determines follow up scanning.  See below for description of categories, and follow up screening recommendations. We will be in touch to schedule your follow up screening annually or based on recommendations of our providers. We will fax a copy of your scan results to your Primary Care Physician, or the physician who referred you to the program, to ensure they have the results. Please call the office if you have any questions or concerns regarding your scanning experience or results.  Our office number is [phone]. Please speak with Denise Phelps, RN. She is our Lung Cancer Screening RN. If she is unavailable when you call, please have the office staff send her a message. She will return your call at her earliest convenience. Remember, if your scan is normal, we will scan you annually as long as you continue to meet the criteria for the program. (Age 55-77, Current smoker or smoker who has quit within the last 15 years). If you are a smoker, remember, quitting is the single most powerful action that you can take to decrease your risk of lung cancer and other pulmonary, breathing related problems. We know quitting is hard, and we are here to help.  Please let us know if there is anything we can do to help you meet your goal of quitting. If you are a former smoker, congratulations. We are proud of you! Remain smoke free! Remember you can refer friends or family members through the number above.  We will screen them to make sure they meet criteria for the program. Thank you for helping us take better care of you by participating in Lung Screening.  Lung RADS Categories:  Lung RADS 1: no nodules  or definitely non-concerning nodules.  Recommendation is for a repeat annual scan in 12 months.  Lung RADS 2:  nodules that are non-concerning in appearance and behavior with a very low likelihood of becoming an active cancer. Recommendation is for a repeat annual scan in 12 months.  Lung RADS 3: nodules that are probably non-concerning , includes nodules with a low likelihood of becoming an active cancer.  Recommendation is for a 6-month repeat screening scan. Often noted after an upper respiratory illness. We will be in touch to make sure you have no questions, and to schedule your 6-month scan.  Lung RADS 4 A: nodules with concerning findings, recommendation is most often for a follow up scan in 3 months or additional testing based on our provider's assessment of the scan. We will be in touch to make sure you have no questions and to schedule the recommended 3 month follow up scan.  Lung RADS 4 B:  indicates findings that are concerning. We will be in touch with you to schedule additional diagnostic testing based on our provider's  assessment of the scan.   

## 2020-11-22 ENCOUNTER — Ambulatory Visit (INDEPENDENT_AMBULATORY_CARE_PROVIDER_SITE_OTHER): Payer: 59 | Admitting: Internal Medicine

## 2020-11-22 ENCOUNTER — Encounter: Payer: Self-pay | Admitting: Internal Medicine

## 2020-11-22 ENCOUNTER — Other Ambulatory Visit: Payer: Self-pay

## 2020-11-22 VITALS — BP 140/88 | HR 68 | Temp 97.9°F | Ht 76.0 in | Wt 220.0 lb

## 2020-11-22 DIAGNOSIS — M71121 Other infective bursitis, right elbow: Secondary | ICD-10-CM

## 2020-11-22 MED ORDER — DICLOFENAC SODIUM 75 MG PO TBEC: 75.0000 mg | DELAYED_RELEASE_TABLET | Freq: Two times a day (BID) | ORAL | 0 refills | Status: DC

## 2020-11-22 MED ORDER — SULFAMETHOXAZOLE-TRIMETHOPRIM 800-160 MG PO TABS: 1.0000 | ORAL_TABLET | Freq: Two times a day (BID) | ORAL | 0 refills | Status: DC

## 2020-11-22 NOTE — Patient Instructions (Addendum)
Avoid pressure on the elbow.  You can ice the elbow x 20 minutes intermittently.  Take the prescribed medication: an antibiotic twice daily x 10 days and diclofenac for pain twice daily.    Please call if there is no improvement in your symptoms.    Follow up with Dr Yetta Barre on Thursday

## 2020-11-22 NOTE — Progress Notes (Signed)
Subjective:    Patient ID: Rodney Williams, male    DOB: 02-Aug-1960, 60 y.o.   MRN: [id number]  HPI The patient is here for an acute visit.   Fluid in elbow - he has history of right elbow bursitis and the fluid has been drained by his PCP last month- there was no evidence of crystals.  It was traumatic in nature-he did hit it on a towel rack the day prior.  It had resolved when he saw Dr Yetta Barre 10 days later in April.  About one week after the fluid was drained he started to have pain again and that got worse.  The end of last week the elbow got larger and more painful.     He denies fever.  He denies warmth.    He has iced it .Marland Kitchen  He has taken ibuprofen.    Medications and allergies reviewed with patient and updated if appropriate.  Patient Active Problem List   Diagnosis Date Noted  . Tobacco abuse 10/15/2020  . Olecranon bursitis of right elbow 10/11/2020  . COPD (chronic obstructive pulmonary disease) with chronic bronchitis (HCC) 05/01/2017  . Hematuria, microscopic 11/18/2012  . Hyperlipidemia LDL goal < 70 11/18/2012  . 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 PAD (peripheral artery disease) (HCC) 10/28/2012  . Other abnormal glucose 11/23/2011  . Routine general medical examination at a health care facility 11/23/2011  . Screening, ischemic heart disease 11/23/2011    Current Outpatient Medications on File Prior to Visit  Medication Sig Dispense Refill  . Fluticasone-Umeclidin-Vilant (TRELEGY ELLIPTA) 100-62.5-25 MCG/INH AEPB Inhale 1 puff into the lungs daily. 3 each 0   No current facility-administered medications on file prior to visit.    Past Medical History:  Diagnosis Date  . Tobacco abuse    smoke for 40 years    Past Surgical History:  Procedure Laterality Date  . INGUINAL HERNIA REPAIR     left  . ULNAR NERVE REPAIR     left  . ulnar nerve surgery  per pt. 7-8 years ago   Left elbow and wrist    Social History   Socioeconomic History   . Marital status: Single    Spouse name: Not on file  . Number of children: 0  . Years of education: Not on file  . Highest education level: Not on file  Occupational History  . Occupation: Market researcher: [employer]  Tobacco Use  . Smoking status: Current Some Day Smoker    Packs/day: 2.00    Years: 40.00    Pack years: 80.00    Types: Cigarettes  . Smokeless tobacco: Never Used  Substance and Sexual Activity  . Alcohol use: No  . Drug use: No  . Sexual activity: Yes  Other Topics Concern  . Not on file  Social History Narrative  . Not on file   Social Determinants of Health   Financial Resource Strain: Not on file  Food Insecurity: Not on file  Transportation Needs: Not on file  Physical Activity: Not on file  Stress: Not on file  Social Connections: Not on file    Family History  Problem Relation Age of Onset  . Diabetes Father   . Alcohol abuse Neg Hx   . Cancer Neg Hx   . Early death Neg Hx   . Heart disease Neg Hx   . Hyperlipidemia Neg Hx   . Hypertension Neg Hx   . Stroke Neg Hx  Review of Systems  Constitutional: Negative for chills, diaphoresis, fatigue and fever.  Musculoskeletal:       Swelling at elbow.  Full range of motion with some discomfort  Skin: Negative for color change and wound.  Neurological: Positive for numbness (Slight numbness and tingling near elbow-likely related to swelling). Negative for weakness.       Objective:   Vitals:   11/22/20 0820  BP: 140/88  Pulse: 68  Temp: 97.9 F (36.6 C)  SpO2: 96%   BP Readings from Last 3 Encounters:  11/22/20 140/88  11/19/20 136/88  10/21/20 124/78   Wt Readings from Last 3 Encounters:  11/22/20 220 lb (99.8 kg)  11/19/20 215 lb 12.8 oz (97.9 kg)  10/21/20 210 lb (95.3 kg)   Body mass index is 26.78 kg/m.   Physical Exam Constitutional:      General: He is not in acute distress.    Appearance: Normal appearance. He is not ill-appearing.  HENT:     Head:  Normocephalic and atraumatic.  Musculoskeletal:     Comments: Right olecranon bursitis with significant swelling, slightly tender to palpation.  Minimal increase in warmth.  Full range of motion of elbow, with some discomfort  Skin:    General: Skin is warm and dry.     Findings: No bruising or erythema.  Neurological:     Mental Status: He is alert.     Sensory: No sensory deficit.     Motor: No weakness.            Assessment & Plan:    Septic olecranon bursitis, right elbow: Acute Traumatic bursitis last month, but bursa drained and improved, but later he started to have pain, increased swelling that worsened significantly the end of last week He does appear to have an infection Start Bactrim DS 800-160 mg twice daily x10 days Diclofenac 75 mg twice daily with food He will need to take the week off of work because his job is very physical-note given Follow-up on Thursday with PCP for reevaluation Advised that if he feels any worse, has fevers or his elbow appears to be getting worse then he needs to call immediately      This visit occurred during the SARS-CoV-2 public health emergency.  Safety protocols were in place, including screening questions prior to the visit, additional usage of staff PPE, and extensive cleaning of exam room while observing appropriate contact time as indicated for disinfecting solutions.

## 2020-11-25 ENCOUNTER — Ambulatory Visit (INDEPENDENT_AMBULATORY_CARE_PROVIDER_SITE_OTHER): Payer: 59 | Admitting: Internal Medicine

## 2020-11-25 ENCOUNTER — Encounter: Payer: Self-pay | Admitting: Internal Medicine

## 2020-11-25 ENCOUNTER — Other Ambulatory Visit: Payer: Self-pay

## 2020-11-25 VITALS — BP 118/76 | Temp 98.1°F | Ht 76.0 in | Wt 217.0 lb

## 2020-11-25 DIAGNOSIS — M7021 Olecranon bursitis, right elbow: Secondary | ICD-10-CM

## 2020-11-25 MED ORDER — METHYLPREDNISOLONE ACETATE 40 MG/ML IJ SUSP
40.0000 mg | Freq: Once | INTRAMUSCULAR | Status: AC
  Administered 2020-11-25: 40 mg via INTRAMUSCULAR

## 2020-11-25 NOTE — Progress Notes (Addendum)
Subjective:  Patient ID: Rodney Williams, male    DOB: [birth year]  Age: 60 y.o. MRN: [id number]  CC: Follow-up  This visit occurred during the SARS-CoV-2 public health emergency.  Safety protocols were in place, including screening questions prior to the visit, additional usage of staff PPE, and extensive cleaning of exam room while observing appropriate contact time as indicated for disinfecting solutions.    HPI Rodney Williams presents for f/up -  I saw him about a month ago for treatment of right olecranon bursitis.  Fluid was sent to the lab and was negative for infections, crystals, or findings suspicious for gout.  He responded well to an injection of Depo-Medrol, rest, and a compressive dressing.  He was doing well until about a week or 2 ago when he started swinging a hammer again and with the repetitive activity the area became red and swollen again.  He saw someone else a few days ago and has been taking Bactrim DS with modest improvement.  Outpatient Medications Prior to Visit  Medication Sig Dispense Refill  . diclofenac (VOLTAREN) 75 MG EC tablet Take 1 tablet (75 mg total) by mouth 2 (two) times daily. 30 tablet 0  . Fluticasone-Umeclidin-Vilant (TRELEGY ELLIPTA) 100-62.5-25 MCG/INH AEPB Inhale 1 puff into the lungs daily. 3 each 0  . sulfamethoxazole-trimethoprim (BACTRIM DS) 800-160 MG tablet Take 1 tablet by mouth 2 (two) times daily. 20 tablet 0   No facility-administered medications prior to visit.    ROS Review of Systems  Constitutional: Negative for chills, fatigue and fever.  Musculoskeletal: Positive for arthralgias. Negative for neck pain.  Hematological: Negative for adenopathy. Does not bruise/bleed easily.    Objective:  BP 118/76 (BP Location: Left Arm, Patient Position: Sitting, Cuff Size: Large)   Temp 98.1 F (36.7 C) (Oral)   Ht 6\' 4"  (1.93 m)   Wt 217 lb (98.4 kg)   BMI 26.41 kg/m   BP Readings from Last 3 Encounters:  11/25/20 118/76   11/22/20 140/88  11/19/20 136/88    Wt Readings from Last 3 Encounters:  11/25/20 217 lb (98.4 kg)  11/22/20 220 lb (99.8 kg)  11/19/20 215 lb 12.8 oz (97.9 kg)    Physical Exam Vitals reviewed.  Musculoskeletal:     Right elbow: Swelling and effusion present. No deformity or lacerations. Normal range of motion. No tenderness.     Comments: Over the olecranon bursa there is erythema, swelling, warmth, and tenderness.  There is normal range of motion in the right elbow. There is a small abrasion with faint peeling over the tip of the olecranon.  Neurological:     Mental Status: He is alert.     Lab Results  Component Value Date   WBC 6.6 11/23/2011   HGB 15.6 11/23/2011   HCT 46.5 11/23/2011   PLT 178.0 11/23/2011   GLUCOSE 84 11/18/2012   CHOL 174 11/18/2012   TRIG 294.0 (H) 11/18/2012   HDL 29.10 (L) 11/18/2012   LDLDIRECT 101.9 11/18/2012   ALT 20 11/18/2012   AST 18 11/18/2012   NA 140 11/18/2012   K 3.7 11/18/2012   CL 105 11/18/2012   CREATININE 1.3 11/18/2012   BUN 14 11/18/2012   CO2 28 11/18/2012   TSH 2.60 11/23/2011   PSA 0.69 11/23/2011   HGBA1C 5.8 11/23/2011    CT CHEST LUNG CA SCREEN LOW DOSE W/O CM  Result Date: 11/22/2020 CLINICAL DATA:  60 year old male current smoker with 60 pack-year history of smoking. Lung  cancer screening examination. EXAM: CT CHEST WITHOUT CONTRAST LOW-DOSE FOR LUNG CANCER SCREENING TECHNIQUE: Multidetector CT imaging of the chest was performed following the standard protocol without IV contrast. COMPARISON:  No priors. FINDINGS: Cardiovascular: Heart size is normal. There is no significant pericardial fluid, thickening or pericardial calcification. Aortic atherosclerosis. No definite coronary artery calcifications. Mediastinum/Nodes: No pathologically enlarged mediastinal or hilar lymph nodes. Please note that accurate exclusion of hilar adenopathy is limited on noncontrast CT scans. Esophagus is unremarkable in appearance. No  axillary lymphadenopathy. Lungs/Pleura: No suspicious appearing pulmonary nodules or masses are noted. No acute consolidative airspace disease. No pleural effusions. Diffuse bronchial wall thickening with mild centrilobular and paraseptal emphysema. Upper Abdomen: Unremarkable. Musculoskeletal: There are no aggressive appearing lytic or blastic lesions noted in the visualized portions of the skeleton. IMPRESSION: 1. Lung-RADS 1, negative. Continue annual screening with low-dose chest CT without contrast in 12 months. 2. Aortic atherosclerosis. 3. Mild diffuse bronchial wall thickening with mild centrilobular and paraseptal emphysema; imaging findings suggestive of underlying COPD. Aortic Atherosclerosis (ICD10-I70.0) and Emphysema (ICD10-J43.9). Electronically Signed   By: Trudie Reed M.D.   On: 11/22/2020 10:05   After verbal consent was obtained.  Using sterile technique the right elbow was prepped/draped and Lidocaine 2% w epi was used as local anesthetic(3 cc's used). The joint was entered and 7 mls of serosanguinous fluid was withdrawn and sent for culture, cell count, and screen for crystals.  Steroid 40 mg (Depo-Medrol) was then injected and the needle withdrawn.  The procedure was well tolerated.  The patient is asked to continue to rest the joint for a few more days before resuming regular activities.     Assessment & Plan:   Rodney Williams was seen today for follow-up.  Diagnoses and all orders for this visit:  Olecranon bursitis of right elbow- Since this has recurred I recommended that he undergo a plain film to see if there is a structural lesion and to see orthopedics to see if he needs to have a surgical intervention.  The cell count is positive for neutrophils.  Will continue Bactrim DS.  The culture is pending.  Screening for crystals is pending.  A bandage and Ace wrap were placed over the right elbow.  I have asked him to rest and protect the elbow for the next week. -     DG Elbow  Complete Right; Future -     Cell count + diff,  w/ cryst-synvl fld -     WOUND CULTURE; Future -     Ambulatory referral to Orthopedic Surgery -     WOUND CULTURE -     methylPREDNISolone acetate (DEPO-MEDROL) injection 40 mg  Other orders -     Cell Count + Diff, w/o Cryst, Synvl Fld.   I am having Alveda Reasons "Rodney Williams" maintain his Trelegy Ellipta, sulfamethoxazole-trimethoprim, and diclofenac. We administered methylPREDNISolone acetate.  Meds ordered this encounter  Medications  . methylPREDNISolone acetate (DEPO-MEDROL) injection 40 mg     Follow-up: No follow-ups on file.  Sanda Linger, MD

## 2020-11-26 ENCOUNTER — Encounter: Payer: Self-pay | Admitting: Internal Medicine

## 2020-11-26 LAB — CELL COUNT + DIFF, W/O CRYST-SYNVL FLD
Basophils, %: 0 %
Eosinophils-Synovial: 0 % (ref 0–2)
Lymphocytes-Synovial Fld: 1 % (ref 0–74)
Monocyte/Macrophage: 1 % (ref 0–69)
Neutrophil, Synovial: 98 % — ABNORMAL HIGH (ref 0–24)
Synoviocytes, %: 0 % (ref 0–15)
WBC, Synovial: 8070 cells/uL — ABNORMAL HIGH (ref <150)

## 2020-11-28 LAB — WOUND CULTURE

## 2020-12-02 ENCOUNTER — Other Ambulatory Visit: Payer: Self-pay | Admitting: *Deleted

## 2020-12-02 DIAGNOSIS — F17200 Nicotine dependence, unspecified, uncomplicated: Secondary | ICD-10-CM

## 2020-12-02 NOTE — Progress Notes (Signed)
Please call patient and let them  know their  low dose Ct was read as a Lung RADS 1, negative study: no nodules or definitely benign nodules. Radiology recommendation is for a repeat LDCT in 12 months. .Please let them  know we will order and schedule their  annual screening scan for 10/2021. Please let them  know there was notation of CAD on their  scan.  Please remind the patient  that this is a non-gated exam therefore degree or severity of disease  cannot be determined. Please have them  follow up with their PCP regarding potential risk factor modification, dietary therapy or pharmacologic therapy if clinically indicated. Pt.  is not  currently on statin therapy. Please place order for annual  screening scan for  10/2021 and fax results to PCP. Thanks so much. Have patient follow up with PCP regarding finding od aortic atherosclerosis.  I do not see an echo on file.  Thanks so much

## 2020-12-02 NOTE — Progress Notes (Signed)
Please call patient and let them  know their  low dose Ct was read as a Lung RADS 1, negative study: no nodules or definitely benign nodules. Radiology recommendation is for a repeat LDCT in 12 months. .Please let them  know we will order and schedule their  annual screening scan for 10/2021. Please let them  know there was notation of CAD on their  scan.  Please remind the patient  that this is a non-gated exam therefore degree or severity of disease  cannot be determined. Please have them  follow up with their PCP regarding potential risk factor modification, dietary therapy or pharmacologic therapy if clinically indicated. Pt.  is  not currently on statin therapy. Please place order for annual  screening scan for  10/2021 and fax results to PCP. Thanks so much. Please have patient follow up with PCP about aortic stenosis. She has never had a 2 D echo that I can see.

## 2020-12-08 ENCOUNTER — Other Ambulatory Visit: Payer: Self-pay | Admitting: Internal Medicine

## 2020-12-08 DIAGNOSIS — M71121 Other infective bursitis, right elbow: Secondary | ICD-10-CM

## 2020-12-08 MED ORDER — NUZYRA 150 MG PO TABS: 3.0000 | ORAL_TABLET | Freq: Every day | ORAL | 0 refills | Status: AC

## 2020-12-08 MED ORDER — NUZYRA 150 MG PO TABS: 2.0000 | ORAL_TABLET | Freq: Every day | ORAL | 0 refills | Status: AC

## 2020-12-09 ENCOUNTER — Telehealth: Payer: Self-pay

## 2020-12-09 NOTE — Telephone Encounter (Signed)
Nuzyra sample approved by PCP.  Pt here to receive them.  Instructions given: 3 pills today, 3 pills tomorrow & prescription will be mailed from specialty pharmacy in [place]. Remaining prescription is 2 pills/day for 10days. Pt verb understanding.

## 2020-12-15 ENCOUNTER — Ambulatory Visit: Payer: Self-pay

## 2020-12-15 ENCOUNTER — Other Ambulatory Visit: Payer: Self-pay | Admitting: Internal Medicine

## 2020-12-15 ENCOUNTER — Ambulatory Visit (INDEPENDENT_AMBULATORY_CARE_PROVIDER_SITE_OTHER): Payer: 59 | Admitting: Orthopedic Surgery

## 2020-12-15 ENCOUNTER — Other Ambulatory Visit: Payer: Self-pay

## 2020-12-15 DIAGNOSIS — M7021 Olecranon bursitis, right elbow: Secondary | ICD-10-CM

## 2020-12-15 DIAGNOSIS — M25521 Pain in right elbow: Secondary | ICD-10-CM

## 2020-12-17 ENCOUNTER — Encounter: Payer: Self-pay | Admitting: Orthopedic Surgery

## 2020-12-17 NOTE — Progress Notes (Signed)
Office Visit Note   Patient: Rodney Williams           Date of Birth: 1960-10-11           MRN: [id number] Visit Date: 12/15/2020 Requested by: Etta Grandchild, MD [address] [place],  [place] [address] PCP: Etta Grandchild, MD  Subjective: Chief Complaint  Patient presents with   Right Elbow - Pain    HPI: Rodney Williams is a 60 year old patient with right elbow olecranon bursitis for 1 month.  He hit and noticed some swelling in it about a month ago.  Aspiration has been performed x2.  He was advised that he had a staph infection in the elbow.  This improved with oral antibiotics.  He was taking Bactrim but now he is on [place] day 5 of a 10-day course.  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  Hurts him to fish which is his primary source of having fun.  Denies any systemic symptoms of fevers or chills.              ROS: All systems reviewed are negative as they relate to the chief complaint within the history of present illness.  Patient denies  fevers or chills.   Assessment & Plan: Visit Diagnoses:  1. Pain in right elbow     Plan: Impression is right elbow olecranon bursitis with no fluid in the bursa right now.  There is some bursal tissue present.  The elbow itself has no drainage induration or fluctuance.  In general I think at this time the elbow has been at least partially treated with his oral antibiotics and aspiration.  I do not see a clear-cut indication at this time for elbow bursectomy.  However this could change once he comes off his antibiotics.  Plan is to return to the office on the 25th for a decision for or against surgery at that time.  Overall he is improving.  Follow-Up Instructions: Return in about 10 days (around 12/25/2020).   Orders:  Orders Placed This Encounter  Procedures   XR Elbow 2 Views Right   No orders of the defined types were placed in this encounter.     Procedures: No procedures  performed   Clinical Data: No additional findings.  Objective: Vital Signs: There were no vitals taken for this visit.  Physical Exam:   Constitutional: Patient appears well-developed HEENT:  Head: Normocephalic Eyes:EOM are normal Neck: Normal range of motion Cardiovascular: Normal rate Pulmonary/chest: Effort normal Neurologic: Patient is alert Skin: Skin is warm Psychiatric: Patient has normal mood and affect   Ortho Exam: Ortho exam demonstrates full range of motion of the right elbow.  No proximal lymphadenopathy is present.  He does have about a 4 x 3 cm area of cellulitis and olecranon bursitis without discrete fluid pocket present.  No induration fluctuance or erythema is present.  Motor or sensory function to the hand is intact.  The area itself is not particularly tender to palpation.  No draining sinuses.  Specialty Comments:  No specialty comments available.  Imaging: No results found.   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   PAD (peripheral artery disease) (HCC) 10/28/2012   Routine general medical examination at a health care facility 11/23/2011   Screening, ischemic heart disease 11/23/2011   Past Medical History:  Diagnosis Date   Tobacco abuse    smoke for 40 years    Family History  Problem Relation Age of Onset   Diabetes Father    Alcohol abuse Neg Hx    Cancer Neg Hx    Early death Neg Hx    Heart disease Neg Hx    Hyperlipidemia Neg Hx    Hypertension Neg Hx    Stroke Neg Hx     Past Surgical History:  Procedure Laterality Date   INGUINAL HERNIA REPAIR     left    ULNAR NERVE REPAIR     left   ulnar nerve surgery  per pt. 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

## 2020-12-27 ENCOUNTER — Ambulatory Visit (INDEPENDENT_AMBULATORY_CARE_PROVIDER_SITE_OTHER): Payer: 59 | Admitting: Orthopedic Surgery

## 2020-12-27 ENCOUNTER — Other Ambulatory Visit: Payer: Self-pay

## 2020-12-27 ENCOUNTER — Encounter: Payer: Self-pay | Admitting: Orthopedic Surgery

## 2020-12-27 DIAGNOSIS — M7021 Olecranon bursitis, right elbow: Secondary | ICD-10-CM | POA: Diagnosis not present

## 2020-12-27 NOTE — Progress Notes (Signed)
Office Visit Note   Patient: Rodney Williams           Date of Birth: [birth year]           MRN: [id number] Visit Date: 12/27/2020 Requested by: Etta Grandchild, MD [address] [place],  [place] [address] PCP: Etta Grandchild, MD  Subjective: Chief Complaint  Patient presents with   Right Elbow - Follow-up    HPI: Rodney Williams is a 60 year old patient with right elbow pain.  The elbow has gotten smaller in terms of inflammation but he reports continued pain.  Denies any fevers and chills and he has been off antibiotics for about 6 days.  Symptoms of been ongoing for 2 months.  This is interfering with his recreational plans.              ROS: All systems reviewed are negative as they relate to the chief complaint within the history of present illness.  Patient denies  fevers or chills.   Assessment & Plan: Visit Diagnoses:  1. Olecranon bursitis, right elbow     Plan: Impression is right elbow pain with symptomatic olecranon bursitis.  No evidence of current infection.  Ultrasound examination today demonstrates boggy bursitis but no fluid collection.  Discussed with Rodney Williams the operative and nonoperative treatment options for this problem which include but not limited to infection nerve vessel damage as well as elbow stiffness with operative treatment.  Based on the pain is having he would like to have this elbow bursa removed.  Patient understands risk and benefits as well as the rehabilitative process involved.  All questions answered.  Plan to do that in the near future per patient request.  Follow-Up Instructions: No follow-ups on file.   Orders:  No orders of the defined types were placed in this encounter.  No orders of the defined types were placed in this encounter.     Procedures: No procedures performed   Clinical Data: No additional findings.  Objective: Vital Signs: There were no vitals taken for this visit.  Physical Exam:   Constitutional: Patient appears  well-developed HEENT:  Head: Normocephalic Eyes:EOM are normal Neck: Normal range of motion Cardiovascular: Normal rate Pulmonary/chest: Effort normal Neurologic: Patient is alert Skin: Skin is warm Psychiatric: Patient has normal mood and affect   Ortho Exam: Ortho exam demonstrates full active and passive range of motion of the right elbow with no proximal lymphadenopathy.  Does have significant tenderness and boggy synovitis around the olecranon.  Ulnar nerve nontender and nonsubluxate of all.  Pronation supination is full.  No other masses lymphadenopathy or skin changes noted in that right elbow region.  Specialty Comments:  No specialty comments available.  Imaging: No results found.   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   PAD (peripheral artery disease) (HCC) 10/28/2012   Routine general medical examination at a health care facility 11/23/2011   Screening, ischemic heart disease 11/23/2011   Past Medical History:  Diagnosis Date   Tobacco abuse    smoke for 40 years    Family History  Problem Relation Age of Onset   Diabetes Father    Alcohol abuse  Neg Hx    Cancer Neg Hx    Early death Neg Hx    Heart disease Neg Hx    Hyperlipidemia Neg Hx    Hypertension Neg Hx    Stroke Neg Hx     Past Surgical History:  Procedure Laterality Date   INGUINAL HERNIA REPAIR     left   ULNAR NERVE REPAIR     left   ulnar nerve surgery  per pt. 7-8 years ago   Left elbow and wrist   Social History   Occupational History   Occupation: Market researcher: [employer]  Tobacco Use   Smoking status:  Some Days    Packs/day: 2.00    Years: 40.00    Pack years: 80.00    Types: Cigarettes   Smokeless tobacco: Never  Substance and Sexual Activity   Alcohol use: No   Drug use: No   Sexual activity: Yes

## 2020-12-28 ENCOUNTER — Other Ambulatory Visit: Payer: Self-pay

## 2020-12-28 ENCOUNTER — Encounter (HOSPITAL_BASED_OUTPATIENT_CLINIC_OR_DEPARTMENT_OTHER): Payer: Self-pay | Admitting: Orthopedic Surgery

## 2020-12-30 ENCOUNTER — Encounter (HOSPITAL_BASED_OUTPATIENT_CLINIC_OR_DEPARTMENT_OTHER)
Admission: RE | Admit: 2020-12-30 | Discharge: 2020-12-30 | Disposition: A | Discharge status: Home / Self Care | Payer: 59 | Source: Ambulatory Visit | Attending: Orthopedic Surgery | Admitting: Orthopedic Surgery

## 2020-12-30 DIAGNOSIS — M7021 Olecranon bursitis, right elbow: Secondary | ICD-10-CM | POA: Diagnosis not present

## 2020-12-30 DIAGNOSIS — F17210 Nicotine dependence, cigarettes, uncomplicated: Secondary | ICD-10-CM | POA: Diagnosis not present

## 2020-12-30 DIAGNOSIS — Z7951 Long term (current) use of inhaled steroids: Secondary | ICD-10-CM | POA: Diagnosis not present

## 2020-12-30 LAB — BASIC METABOLIC PANEL
Anion gap: 10 (ref 5–15)
BUN: 15 mg/dL (ref 6–20)
CO2: 27 mmol/L (ref 22–32)
Calcium: 9.1 mg/dL (ref 8.9–10.3)
Chloride: 99 mmol/L (ref 98–111)
Creatinine, Ser: 1.44 mg/dL — ABNORMAL HIGH (ref 0.61–1.24)
GFR, Estimated: 56 mL/min — ABNORMAL LOW (ref >60)
Glucose, Bld: 102 mg/dL — ABNORMAL HIGH (ref 70–99)
Potassium: 4.6 mmol/L (ref 3.5–5.1)
Sodium: 136 mmol/L (ref 135–145)

## 2020-12-30 LAB — CBC
HCT: 51.7 % (ref 39.0–52.0)
Hemoglobin: 17.5 g/dL — ABNORMAL HIGH (ref 13.0–17.0)
MCH: 30.3 pg (ref 26.0–34.0)
MCHC: 33.8 g/dL (ref 30.0–36.0)
MCV: 89.6 fL (ref 80.0–100.0)
Platelets: 207 10*3/uL (ref 150–400)
RBC: 5.77 MIL/uL (ref 4.22–5.81)
RDW: 14.2 % (ref 11.5–15.5)
WBC: 6.7 10*3/uL (ref 4.0–10.5)
nRBC: 0 % (ref 0.0–0.2)

## 2020-12-31 ENCOUNTER — Ambulatory Visit (HOSPITAL_BASED_OUTPATIENT_CLINIC_OR_DEPARTMENT_OTHER): Payer: 59 | Admitting: Anesthesiology

## 2020-12-31 ENCOUNTER — Ambulatory Visit (HOSPITAL_BASED_OUTPATIENT_CLINIC_OR_DEPARTMENT_OTHER)
Admission: RE | Admit: 2020-12-31 | Discharge: 2020-12-31 | Disposition: A | Discharge status: Home / Self Care | Payer: 59 | Attending: Orthopedic Surgery | Admitting: Orthopedic Surgery

## 2020-12-31 ENCOUNTER — Encounter (HOSPITAL_BASED_OUTPATIENT_CLINIC_OR_DEPARTMENT_OTHER): Payer: Self-pay | Admitting: Orthopedic Surgery

## 2020-12-31 ENCOUNTER — Encounter (HOSPITAL_BASED_OUTPATIENT_CLINIC_OR_DEPARTMENT_OTHER)
Admission: RE | Disposition: A | Discharge status: Home / Self Care | Payer: Self-pay | Source: Home / Self Care | Attending: Orthopedic Surgery

## 2020-12-31 ENCOUNTER — Other Ambulatory Visit: Payer: Self-pay

## 2020-12-31 DIAGNOSIS — M7021 Olecranon bursitis, right elbow: Secondary | ICD-10-CM | POA: Diagnosis not present

## 2020-12-31 DIAGNOSIS — Z7951 Long term (current) use of inhaled steroids: Secondary | ICD-10-CM | POA: Insufficient documentation

## 2020-12-31 DIAGNOSIS — F17210 Nicotine dependence, cigarettes, uncomplicated: Secondary | ICD-10-CM | POA: Insufficient documentation

## 2020-12-31 HISTORY — DX: Chronic obstructive pulmonary disease, unspecified: J44.9

## 2020-12-31 HISTORY — DX: Other complications of anesthesia, initial encounter: T88.59XA

## 2020-12-31 HISTORY — DX: Olecranon bursitis, right elbow: M70.21

## 2020-12-31 HISTORY — PX: OLECRANON BURSECTOMY: SHX2097

## 2020-12-31 SURGERY — BURSECTOMY, ELBOW
Anesthesia: General | Site: Elbow | Laterality: Right

## 2020-12-31 MED ORDER — VANCOMYCIN HCL 1000 MG IV SOLR
INTRAVENOUS | Status: DC | PRN
  Administered 2020-12-31: 1000 mg via INTRAVENOUS

## 2020-12-31 MED ORDER — FENTANYL CITRATE (PF) 100 MCG/2ML IJ SOLN: 25.0000 ug | INTRAMUSCULAR | Status: DC | PRN

## 2020-12-31 MED ORDER — VANCOMYCIN HCL IN DEXTROSE 1-5 GM/200ML-% IV SOLN
INTRAVENOUS | Status: AC
  Filled 2020-12-31: qty 200

## 2020-12-31 MED ORDER — DEXAMETHASONE SODIUM PHOSPHATE 10 MG/ML IJ SOLN
INTRAMUSCULAR | Status: DC | PRN
  Administered 2020-12-31: 10 mg via INTRAVENOUS

## 2020-12-31 MED ORDER — LACTATED RINGERS IV SOLN: INTRAVENOUS | Status: DC

## 2020-12-31 MED ORDER — OXYCODONE HCL 5 MG PO TABS: 5.0000 mg | ORAL_TABLET | ORAL | 0 refills | Status: DC | PRN

## 2020-12-31 MED ORDER — MIDAZOLAM HCL 2 MG/2ML IJ SOLN
INTRAMUSCULAR | Status: AC
  Filled 2020-12-31: qty 2

## 2020-12-31 MED ORDER — CEFAZOLIN SODIUM-DEXTROSE 2-4 GM/100ML-% IV SOLN: 2.0000 g | INTRAVENOUS | Status: DC

## 2020-12-31 MED ORDER — MORPHINE SULFATE (PF) 4 MG/ML IV SOLN
INTRAVENOUS | Status: DC | PRN
  Administered 2020-12-31: 8 mg via SUBCUTANEOUS

## 2020-12-31 MED ORDER — ONDANSETRON HCL 4 MG/2ML IJ SOLN
INTRAMUSCULAR | Status: AC
  Filled 2020-12-31: qty 2

## 2020-12-31 MED ORDER — FENTANYL CITRATE (PF) 100 MCG/2ML IJ SOLN
INTRAMUSCULAR | Status: AC
  Filled 2020-12-31: qty 2

## 2020-12-31 MED ORDER — CEFAZOLIN SODIUM-DEXTROSE 2-4 GM/100ML-% IV SOLN
INTRAVENOUS | Status: AC
  Filled 2020-12-31: qty 100

## 2020-12-31 MED ORDER — MORPHINE SULFATE (PF) 4 MG/ML IV SOLN
INTRAVENOUS | Status: AC
  Filled 2020-12-31: qty 2

## 2020-12-31 MED ORDER — BUPIVACAINE HCL (PF) 0.5 % IJ SOLN
INTRAMUSCULAR | Status: AC
  Filled 2020-12-31: qty 30

## 2020-12-31 MED ORDER — EPHEDRINE SULFATE 50 MG/ML IJ SOLN
INTRAMUSCULAR | Status: DC | PRN
  Administered 2020-12-31: 10 mg via INTRAVENOUS
  Administered 2020-12-31: 15 mg via INTRAVENOUS

## 2020-12-31 MED ORDER — LIDOCAINE HCL (CARDIAC) PF 100 MG/5ML IV SOSY
PREFILLED_SYRINGE | INTRAVENOUS | Status: DC | PRN
  Administered 2020-12-31: 100 mg via INTRAVENOUS

## 2020-12-31 MED ORDER — DEXAMETHASONE SODIUM PHOSPHATE 10 MG/ML IJ SOLN
INTRAMUSCULAR | Status: AC
  Filled 2020-12-31: qty 1

## 2020-12-31 MED ORDER — PHENYLEPHRINE HCL (PRESSORS) 10 MG/ML IV SOLN
INTRAVENOUS | Status: DC | PRN
  Administered 2020-12-31: 120 ug via INTRAVENOUS

## 2020-12-31 MED ORDER — POVIDONE-IODINE 10 % EX SWAB
2.0000 "application " | Freq: Once | CUTANEOUS | Status: AC
  Administered 2020-12-31: 2 via TOPICAL

## 2020-12-31 MED ORDER — VANCOMYCIN HCL 500 MG IV SOLR
INTRAVENOUS | Status: DC | PRN
  Administered 2020-12-31: 500 mg via TOPICAL

## 2020-12-31 MED ORDER — MIDAZOLAM HCL 2 MG/2ML IJ SOLN
INTRAMUSCULAR | Status: DC | PRN
  Administered 2020-12-31: 2 mg via INTRAVENOUS

## 2020-12-31 MED ORDER — POVIDONE-IODINE 7.5 % EX SOLN
Freq: Once | CUTANEOUS | Status: DC
  Filled 2020-12-31: qty 118

## 2020-12-31 MED ORDER — BUPIVACAINE HCL (PF) 0.5 % IJ SOLN
INTRAMUSCULAR | Status: DC | PRN
  Administered 2020-12-31: 30 mL

## 2020-12-31 MED ORDER — FENTANYL CITRATE (PF) 100 MCG/2ML IJ SOLN
INTRAMUSCULAR | Status: DC | PRN
  Administered 2020-12-31: 100 ug via INTRAVENOUS

## 2020-12-31 MED ORDER — LIDOCAINE HCL (PF) 2 % IJ SOLN
INTRAMUSCULAR | Status: AC
  Filled 2020-12-31: qty 5

## 2020-12-31 MED ORDER — PROPOFOL 10 MG/ML IV BOLUS
INTRAVENOUS | Status: DC | PRN
  Administered 2020-12-31: 200 mg via INTRAVENOUS

## 2020-12-31 MED ORDER — CLONIDINE HCL (ANALGESIA) 100 MCG/ML EP SOLN
EPIDURAL | Status: DC | PRN
  Administered 2020-12-31: 100 ug

## 2020-12-31 SURGICAL SUPPLY — 70 items
BLADE SURG 15 STRL LF DISP TIS (BLADE) ×1 IMPLANT
BLADE SURG 15 STRL SS (BLADE) ×3
BNDG CMPR 9X4 STRL LF SNTH (GAUZE/BANDAGES/DRESSINGS) ×1
BNDG COHESIVE 4X5 TAN STRL (GAUZE/BANDAGES/DRESSINGS) ×3 IMPLANT
BNDG ELASTIC 4X5.8 VLCR STR LF (GAUZE/BANDAGES/DRESSINGS) ×6 IMPLANT
BNDG ELASTIC 6X5.8 VLCR STR LF (GAUZE/BANDAGES/DRESSINGS) IMPLANT
BNDG ESMARK 4X9 LF (GAUZE/BANDAGES/DRESSINGS) ×3 IMPLANT
BNDG GAUZE ELAST 4 BULKY (GAUZE/BANDAGES/DRESSINGS) ×3 IMPLANT
CANISTER SUCT 1200ML W/VALVE (MISCELLANEOUS) IMPLANT
CLEANER CAUTERY TIP 5X5 PAD (MISCELLANEOUS) IMPLANT
COVER BACK TABLE 60X90IN (DRAPES) ×3 IMPLANT
COVER MAYO STAND STRL (DRAPES) ×3 IMPLANT
CUFF TOURN SGL QUICK 18 (TOURNIQUET CUFF) ×3 IMPLANT
DECANTER SPIKE VIAL GLASS SM (MISCELLANEOUS) IMPLANT
DRAPE EXTREMITY T 121X128X90 (DISPOSABLE) ×3 IMPLANT
DRAPE INCISE IOBAN 66X45 STRL (DRAPES) ×3 IMPLANT
DRAPE SURG 17X23 STRL (DRAPES) IMPLANT
DRAPE U-SHAPE 47X51 STRL (DRAPES) ×6 IMPLANT
DRSG AQUACEL AG ADV 3.5X 6 (GAUZE/BANDAGES/DRESSINGS) ×3 IMPLANT
DRSG EMULSION OIL 3X3 NADH (GAUZE/BANDAGES/DRESSINGS) IMPLANT
DRSG PAD ABDOMINAL 8X10 ST (GAUZE/BANDAGES/DRESSINGS) ×6 IMPLANT
DURAPREP 26ML APPLICATOR (WOUND CARE) ×3 IMPLANT
ELECT REM PT RETURN 9FT ADLT (ELECTROSURGICAL) ×3
ELECTRODE REM PT RTRN 9FT ADLT (ELECTROSURGICAL) ×1 IMPLANT
GAUZE 4X4 16PLY ~~LOC~~+RFID DBL (SPONGE) ×3 IMPLANT
GAUZE XEROFORM 1X8 LF (GAUZE/BANDAGES/DRESSINGS) IMPLANT
GLOVE SRG 8 PF TXTR STRL LF DI (GLOVE) ×1 IMPLANT
GLOVE SURG ORTHO LTX SZ8 (GLOVE) ×3 IMPLANT
GLOVE SURG SYN 8.0 (GLOVE) ×3 IMPLANT
GLOVE SURG UNDER POLY LF SZ8 (GLOVE) ×3
GOWN STRL REUS W/ TWL LRG LVL3 (GOWN DISPOSABLE) IMPLANT
GOWN STRL REUS W/TWL 2XL LVL3 (GOWN DISPOSABLE) ×3 IMPLANT
GOWN STRL REUS W/TWL LRG LVL3 (GOWN DISPOSABLE)
GOWN STRL REUS W/TWL XL LVL3 (GOWN DISPOSABLE) ×3 IMPLANT
NEEDLE HYPO 22GX1.5 SAFETY (NEEDLE) IMPLANT
NS IRRIG 1000ML POUR BTL (IV SOLUTION) ×3 IMPLANT
PACK BASIN DAY SURGERY FS (CUSTOM PROCEDURE TRAY) ×3 IMPLANT
PAD CAST 4YDX4 CTTN HI CHSV (CAST SUPPLIES) ×2 IMPLANT
PAD CAST CTTN 4X4 STRL (SOFTGOODS) ×2 IMPLANT
PAD CLEANER CAUTERY TIP 5X5 (MISCELLANEOUS)
PADDING CAST COTTON 4X4 STRL (CAST SUPPLIES) ×6
PADDING CAST COTTON 4X4 STRL (SOFTGOODS) ×6
PADDING CAST COTTON 6X4 STRL (CAST SUPPLIES) IMPLANT
PENCIL SMOKE EVACUATOR (MISCELLANEOUS) ×3 IMPLANT
SHEET MEDIUM DRAPE 40X70 STRL (DRAPES) IMPLANT
SLEEVE SCD COMPRESS KNEE MED (STOCKING) ×3 IMPLANT
SLING ARM FOAM STRAP LRG (SOFTGOODS) ×3 IMPLANT
SPLINT FAST PLASTER 5X30 (CAST SUPPLIES) ×20
SPLINT PLASTER CAST FAST 5X30 (CAST SUPPLIES) ×10 IMPLANT
SPLINT PLASTER CAST XFAST 3X15 (CAST SUPPLIES) IMPLANT
SPLINT PLASTER CAST XFAST 4X15 (CAST SUPPLIES) IMPLANT
SPLINT PLASTER XTRA FAST SET 4 (CAST SUPPLIES)
SPLINT PLASTER XTRA FASTSET 3X (CAST SUPPLIES)
SPONGE T-LAP 18X18 ~~LOC~~+RFID (SPONGE) ×3 IMPLANT
STOCKINETTE 4X48 STRL (DRAPES) IMPLANT
STOCKINETTE IMPERVIOUS LG (DRAPES) ×3 IMPLANT
SUCTION FRAZIER HANDLE 10FR (MISCELLANEOUS) ×3
SUCTION TUBE FRAZIER 10FR DISP (MISCELLANEOUS) ×1 IMPLANT
SUT ETHILON 3 0 PS 1 (SUTURE) ×9 IMPLANT
SUT PROLENE 3 0 PS 2 (SUTURE) IMPLANT
SUT VIC AB 3-0 PS1 18 (SUTURE) ×6
SUT VIC AB 3-0 PS1 18XBRD (SUTURE) ×2 IMPLANT
SYR 3ML 18GX1 1/2 (SYRINGE) ×3 IMPLANT
SYR BULB EAR ULCER 3OZ GRN STR (SYRINGE) ×3 IMPLANT
SYR CONTROL 10ML LL (SYRINGE) ×3 IMPLANT
TOWEL GREEN STERILE FF (TOWEL DISPOSABLE) ×3 IMPLANT
TUBE CONNECTING 20'X1/4 (TUBING)
TUBE CONNECTING 20X1/4 (TUBING) IMPLANT
UNDERPAD 30X36 HEAVY ABSORB (UNDERPADS AND DIAPERS) ×3 IMPLANT
YANKAUER SUCT BULB TIP NO VENT (SUCTIONS) IMPLANT

## 2020-12-31 NOTE — Discharge Instructions (Signed)

## 2020-12-31 NOTE — Brief Op Note (Signed)
   12/31/2020  1:33 PM  PATIENT:  Rodney Williams  60 y.o. male  PRE-OPERATIVE DIAGNOSIS:  right olecranon bursitis  POST-OPERATIVE DIAGNOSIS:  right olecranon bursitis  PROCEDURE:  Procedure(s): RIGHT OLECRANON  BURSITIS EXCISION  SURGEON:  Surgeon(s): Cammy Copa, MD  ASSISTANT: none  ANESTHESIA:   general  EBL: 5 ml    Total I/O In: 250 [IV Piggyback:250] Out: 10 [Blood:10]  BLOOD ADMINISTERED: none  DRAINS: none   LOCAL MEDICATIONS USED:  marcaine mso4 clonidine vanco  SPECIMEN:  bone and bursa to culture  COUNTS:  YES  TOURNIQUET:   Total Tourniquet Time Documented: Upper Arm (Right) - 13 minutes Total: Upper Arm (Right) - 13 minutes   DICTATION: .Other Dictation: Dictation Number [id number]  PLAN OF CARE: Discharge to home after PACU  PATIENT DISPOSITION:  PACU - hemodynamically stable

## 2020-12-31 NOTE — Anesthesia Preprocedure Evaluation (Signed)
Anesthesia Evaluation  Patient identified by MRN, date of birth, ID band Patient awake    Reviewed: Allergy & Precautions, NPO status , Patient's Chart, lab work & pertinent test results  Airway Mallampati: II  TM Distance: >3 FB     Dental   Pulmonary COPD, Current Smoker and Patient abstained from smoking.,    breath sounds clear to auscultation       Cardiovascular + Peripheral Vascular Disease   Rhythm:Regular Rate:Normal     Neuro/Psych    GI/Hepatic negative GI ROS, Neg liver ROS,   Endo/Other  negative endocrine ROS  Renal/GU negative Renal ROS     Musculoskeletal   Abdominal   Peds  Hematology negative hematology ROS (+)   Anesthesia Other Findings   Reproductive/Obstetrics                             Anesthesia Physical Anesthesia Plan  ASA: 3  Anesthesia Plan: General   Post-op Pain Management:    Induction: Intravenous  PONV Risk Score and Plan: 3 and Ondansetron, Dexamethasone and Midazolam  Airway Management Planned: LMA  Additional Equipment:   Intra-op Plan:   Post-operative Plan: Extubation in OR  Informed Consent: I have reviewed the patients History and Physical, chart, labs and discussed the procedure including the risks, benefits and alternatives for the proposed anesthesia with the patient or authorized representative who has indicated his/her understanding and acceptance.     Dental advisory given  Plan Discussed with: CRNA and Anesthesiologist  Anesthesia Plan Comments:         Anesthesia Quick Evaluation

## 2020-12-31 NOTE — Anesthesia Procedure Notes (Signed)
Procedure Name: LMA Insertion Date/Time: 12/31/2020 12:41 PM Performed by: Karen Kitchens, CRNA Pre-anesthesia Checklist: Patient identified, Emergency Drugs available, Suction available and Patient being monitored Patient Re-evaluated:Patient Re-evaluated prior to induction Oxygen Delivery Method: Circle system utilized Preoxygenation: Pre-oxygenation with 100% oxygen Induction Type: IV induction Ventilation: Mask ventilation without difficulty LMA: LMA inserted LMA Size: 5.0 Number of attempts: 1 Airway Equipment and Method: Bite block Placement Confirmation: positive ETCO2, breath sounds checked- equal and bilateral and CO2 detector Tube secured with: Tape Dental Injury: Teeth and Oropharynx as per pre-operative assessment

## 2020-12-31 NOTE — H&P (Signed)
Rodney Williams is an 60 y.o. male.   Chief Complaint: Right elbow olecranon bursitis HPI: Rodney Williams is a 60 year old patient with right elbow olecranon bursitis.  This has been going on for slightly over 2 months.  He has had an episode of drainage with methicillin sensitive staph growing out of the elbow olecranon bursa.  He was placed on potent antibiotics and the elbow swelling is actually decreased but he has persistent pain and bogginess as opposed to fluid pocket around the olecranon.  This is a painful event for him.  Denies any fevers or chills.  Desires olecranon bursectomy for pain relief and to resume more normal activities  Past Medical History:  Diagnosis Date   Complication of anesthesia    woke up during surgery   COPD (chronic obstructive pulmonary disease) (HCC)    Olecranon bursitis of right elbow    Tobacco abuse    smoke for 40 years    Past Surgical History:  Procedure Laterality Date   INGUINAL HERNIA REPAIR     left   ULNAR NERVE REPAIR     left   ulnar nerve surgery  per pt. 7-8 years ago   Left elbow and wrist    Family History  Problem Relation Age of Onset   Diabetes Father    Alcohol abuse Neg Hx    Cancer Neg Hx    Early death Neg Hx    Heart disease Neg Hx    Hyperlipidemia Neg Hx    Hypertension Neg Hx    Stroke Neg Hx    Social History:  reports that he has been smoking cigarettes. He has a 60.00 pack-year smoking history. He has never used smokeless tobacco. He reports that he does not drink alcohol and does not use drugs.  Allergies: No Known Allergies  Medications Prior to Admission  Medication Sig Dispense Refill   Fluticasone-Umeclidin-Vilant (TRELEGY ELLIPTA) 100-62.5-25 MCG/INH AEPB Inhale 1 puff into the lungs daily. 3 each 0    Results for orders placed or performed during the hospital encounter of 12/31/20 (from the past 48 hour(s))  CBC     Status: Abnormal   Collection Time: 12/30/20  4:00 PM  Result Value Ref Range   WBC 6.7  4.0 - 10.5 K/uL   RBC 5.77 4.22 - 5.81 MIL/uL   Hemoglobin 17.5 (H) 13.0 - 17.0 g/dL   HCT 16.0 73.7 - 10.6 %   MCV 89.6 80.0 - 100.0 fL   MCH 30.3 26.0 - 34.0 pg   MCHC 33.8 30.0 - 36.0 g/dL   RDW 26.9 48.5 - 46.2 %   Platelets 207 150 - 400 K/uL   nRBC 0.0 0.0 - 0.2 %    Comment: Performed at [facility], [address] [place], [place] [address]  Basic metabolic panel     Status: Abnormal   Collection Time: 12/30/20  4:00 PM  Result Value Ref Range   Sodium 136 135 - 145 mmol/L   Potassium 4.6 3.5 - 5.1 mmol/L   Chloride 99 98 - 111 mmol/L   CO2 27 22 - 32 mmol/L   Glucose, Bld 102 (H) 70 - 99 mg/dL    Comment: Glucose reference range applies only to samples taken after fasting for at least 8 hours.   BUN 15 6 - 20 mg/dL   Creatinine, Ser 0.93 (H) 0.61 - 1.24 mg/dL   Calcium 9.1 8.9 - 81.8 mg/dL   GFR, Estimated 56 (L) >60 mL/min  Comment: (NOTE) Calculated using the CKD-EPI Creatinine Equation (2021)    Anion gap 10 5 - 15    Comment: Performed at [facility], [address], [place], [place] [address]   No results found.  Review of Systems  Musculoskeletal:  Positive for arthralgias.  All other systems reviewed and are negative.  Blood pressure (!) 138/91, pulse 90, temperature 97.8 F (36.6 C), temperature source Oral, resp. rate 18, height 6\' 4"  (1.93 m), weight 98.8 kg, SpO2 98 %. Physical Exam Vitals reviewed.  HENT:     Head: Normocephalic.     Nose: Nose normal.     Mouth/Throat:     Mouth: Mucous membranes are moist.  Eyes:     Pupils: Pupils are equal, round, and reactive to light.  Cardiovascular:     Rate and Rhythm: Normal rate.     Pulses: Normal pulses.  Pulmonary:     Effort: Pulmonary effort is normal.  Abdominal:     General: Abdomen is flat.  Musculoskeletal:     Cervical back: Normal range of motion.  Skin:    General: Skin is warm.     Capillary Refill: Capillary refill takes less than 2 seconds.  Neurological:      General: No focal deficit present.     Mental Status: He is alert.  Psychiatric:        Mood and Affect: Mood normal.  Examination of the right elbow demonstrates good range of motion.  No proximal lymphadenopathy.  EPL FPL interosseous strength intact.  He does have tender boggy olecranon bursitis with no drainage.  Slight warmth but no erythema or fluctuance.  Ultrasound examination in the office demonstrated no large fluid pockets.  No pain with pronation supination of the elbow  Assessment/Plan  Impression is right elbow pain with symptomatic olecranon bursitis.  No evidence of current infection.  Ultrasound examination today demonstrates boggy bursitis but no fluid collection.  Discussed with the operative and nonoperative treatment options for this problem which include but not limited to infection nerve vessel damage as well as elbow stiffness with operative treatment.  Based on the pain is having he would like to have this elbow bursa removed.  Patient understands risk and benefits as well as the rehabilitative process involved.  All questions answered.  Plan to do that in the near future per patient request.  Rodney Cheshire, MD 12/31/2020, 12:14 PM

## 2020-12-31 NOTE — Anesthesia Postprocedure Evaluation (Signed)
Anesthesia Post Note  Patient: Rodney Williams  Procedure(s) Performed: RIGHT OLECRANON  BURSITIS EXCISION (Right: Elbow)     Patient location during evaluation: PACU Anesthesia Type: General Level of consciousness: awake Pain management: pain level controlled Vital Signs Assessment: post-procedure vital signs reviewed and stable Respiratory status: spontaneous breathing Cardiovascular status: stable Postop Assessment: no apparent nausea or vomiting Anesthetic complications: no   No notable events documented.  Last Vitals:  Vitals:   12/31/20 1153 12/31/20 1341  BP: (!) 138/91 116/84  Pulse: 90 81  Resp: 18   Temp: 36.6 C 36.6 C  SpO2: 98% 95%    Last Pain:  Vitals:   12/31/20 1153  TempSrc: Oral  PainSc: 0-No pain                 Cordell Guercio

## 2020-12-31 NOTE — Transfer of Care (Signed)
Immediate Anesthesia Transfer of Care Note  Patient: RONDALE NIES  Procedure(s) Performed: RIGHT OLECRANON  BURSITIS EXCISION (Right: Elbow)  Patient Location: PACU  Anesthesia Type:General  Level of Consciousness: awake, alert  and oriented  Airway & Oxygen Therapy: Patient Spontanous Breathing and Patient connected to face mask oxygen  Post-op Assessment: Report given to RN and Post -op Vital signs reviewed and stable  Post vital signs: Reviewed and stable  Last Vitals:  Vitals Value Taken Time  BP    Temp    Pulse 81 12/31/20 1341  Resp    SpO2 95 % 12/31/20 1341  Vitals shown include unvalidated device data.  Last Pain:  Vitals:   12/31/20 1153  TempSrc: Oral  PainSc: 0-No pain         Complications: No notable events documented.

## 2021-01-04 ENCOUNTER — Encounter (HOSPITAL_BASED_OUTPATIENT_CLINIC_OR_DEPARTMENT_OTHER): Payer: Self-pay | Admitting: Orthopedic Surgery

## 2021-01-04 ENCOUNTER — Other Ambulatory Visit: Payer: Self-pay | Admitting: Radiology

## 2021-01-04 LAB — SURGICAL PATHOLOGY

## 2021-01-04 MED ORDER — CEPHALEXIN 500 MG PO CAPS: ORAL_CAPSULE | ORAL | 0 refills | Status: DC

## 2021-01-04 NOTE — Progress Notes (Signed)
Mssa pls call in keflex 500 po q 8 for 7 days thx

## 2021-01-04 NOTE — Progress Notes (Signed)
kef

## 2021-01-04 NOTE — Addendum Note (Signed)
Addendum  created 01/04/21 0716 by Karen Kitchens, CRNA   Attestation recorded in [place], Intraprocedure Attestations deleted, Intraprocedure Attestations filed

## 2021-01-04 NOTE — Op Note (Signed)
Rodney Williams, Rodney Williams MEDICAL RECORD NO: [id number] ACCOUNT NO: [id number] DATE OF BIRTH: Jun 14, 1961 FACILITY: [facility] LOCATION: [facility] PHYSICIAN: Graylin Shiver. August Saucer, MD  Operative Report   DATE OF PROCEDURE: 12/31/2020  PREOPERATIVE DIAGNOSIS:  Right elbow olecranon bursitis.  POSTOPERATIVE DIAGNOSIS:  Right elbow olecranon bursitis.  PROCEDURE:  Right elbow olecranon bursectomy.  SURGEON:  Attending, Cammy Copa, MD  ASSISTANT:  None.  ANESTHESIA:  General.  INDICATIONS:  The patient is a patient with some recurrent swelling and painful olecranon bursitis, which has potentially been infected within the past 2 months, but currently does not appear infected. Presents now for elective removal of the olecranon  bursa.  DESCRIPTION OF PROCEDURE:  The patient was brought to the operating room where general anesthetic was induced.  Preoperative antibiotics administered.  Timeout was called.  Right arm, hand and shoulder prescrubbed with alcohol and Betadine, allowed to  air dry.  Prepped with DuraPrep solution and draped in a sterile manner.  Ioban used to cover the operative field.  Timeout was called.  The Esmarch was wrapped around the arm, tourniquet inflated.  Total tourniquet time 13 minutes at 250 mmHg.  The skin  was incised over the olecranon bursa.  The skin and subcutaneous tissue were sharply divided.  No occult infection was present but there was boggy bursitis present.  The bursa was excised circumferentially with care being taken to avoid injury to the  ulnar nerve.  Complete resection was achieved.  No overt infection was present within the bursa itself.  There was some inflammation which extended down to the olecranon tip.  Bone was removed and sent for culture.  Bursa also sent for culture.   Following complete bursal excision, thorough irrigation was performed.  Tourniquet was released.  Bleeding points encountered were controlled using electrocautery.  The skin was  tacked back down to the fascia to close the dead space, that was done with  3-0 Vicryl suture, then 3-0 Vicryl suture was used along the skin edges along with 3-0 Ethibond to close the incision.  It should be noted that the skin edges were anesthetized with Marcaine, morphine, clonidine, and vancomycin powder was placed into the  bursal region prior to closure.  An Aquacel dressing and posterior splint applied.  The patient tolerated the procedure well without immediate complication and was transferred to the recovery room in stable condition.   SHW D: 12/31/2020 1:36:55 pm T: 12/31/2020 10:47:00 pm  JOB: [id number]/ [id number]

## 2021-01-04 NOTE — Addendum Note (Signed)
Addendum  created 01/04/21 0718 by Karen Kitchens, CRNA   Charge Capture section accepted

## 2021-01-05 LAB — AEROBIC/ANAEROBIC CULTURE W GRAM STAIN (SURGICAL/DEEP WOUND)

## 2021-01-08 DIAGNOSIS — M7021 Olecranon bursitis, right elbow: Secondary | ICD-10-CM

## 2021-01-10 ENCOUNTER — Ambulatory Visit (INDEPENDENT_AMBULATORY_CARE_PROVIDER_SITE_OTHER): Payer: 59 | Admitting: Surgical

## 2021-01-10 ENCOUNTER — Other Ambulatory Visit: Payer: Self-pay

## 2021-01-10 ENCOUNTER — Encounter: Payer: Self-pay | Admitting: Surgical

## 2021-01-10 DIAGNOSIS — M7021 Olecranon bursitis, right elbow: Secondary | ICD-10-CM

## 2021-01-10 NOTE — Progress Notes (Signed)
Post-Op Visit Note   Patient: Rodney Williams           Date of Birth: 1961/04/06           MRN: [id number] Visit Date: 01/10/2021 PCP: Etta Grandchild, MD   Assessment & Plan:  Chief Complaint:  Chief Complaint  Patient presents with   Right Elbow - Routine Post Op   Visit Diagnoses:  1. Olecranon bursitis, right elbow     Plan: Patient is a 60 year old male who presents s/p right olecranon bursitis excision on 12/31/2020.  He reports that he is doing well.  Denies any fevers, chills, night sweats, malaise.  Pain is controlled and he has been taken out of the splint today.  Incision looks to be healing well with sutures intact.  Sutures removed and replaced with Steri-Strips.  On exam today patient has 2+ radial pulse of the operative extremity with intact wrist extension, EPL, finger abduction, finger adduction.  No expressible drainage or erythema surrounding the incision.  Pathology from time of surgery did show acute bursitis and cultures from surgery showed rare staph aureus.  He was started on Keflex and is due to finish that course early this week.  Plan to hold off on lifting anything heavier than 5 to 10 pounds with the operative arm as the incision heals up.  Follow-up in 10 days for clinical recheck with Dr. August Saucer which will be several days off of antibiotics to make sure that patient has no recurrence of symptoms after he finishes the antibiotic course.  If he is doing well at that point, should be okay to return to work and incision should be healed enough at that point to start more progressive lifting.    Follow-Up Instructions: No follow-ups on file.   Orders:  No orders of the defined types were placed in this encounter.  No orders of the defined types were placed in this encounter.   Imaging: No results found.  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   PAD (peripheral artery disease) (HCC) 10/28/2012   Routine general medical examination at a health care facility 11/23/2011   Screening, ischemic heart disease 11/23/2011   Past Medical History:  Diagnosis Date   Complication of anesthesia    woke up during surgery   COPD (chronic obstructive pulmonary disease) (HCC)    Olecranon bursitis of right elbow    Tobacco abuse    smoke for 40 years    Family History  Problem Relation Age of Onset   Diabetes Father    Alcohol abuse Neg Hx    Cancer Neg Hx    Early death Neg Hx    Heart disease Neg Hx    Hyperlipidemia Neg Hx    Hypertension Neg Hx    Stroke Neg Hx     Past Surgical History:  Procedure Laterality Date   INGUINAL HERNIA REPAIR     left   OLECRANON BURSECTOMY Right 12/31/2020   Procedure: RIGHT OLECRANON  BURSITIS EXCISION;  Surgeon: Cammy Copa, MD;  Location: [facility];  Service: Orthopedics;  Laterality: Right;  ULNAR NERVE REPAIR     left   ulnar nerve surgery  per pt. 7-8 years ago   Left elbow and wrist   Social History   Occupational History   Occupation: Market researcher: [employer]  Tobacco Use   Smoking status: Some Days    Packs/day: 1.50    Years: 40.00    Pack years: 60.00    Types: Cigarettes   Smokeless tobacco: Never  Vaping Use   Vaping Use: Never used  Substance and Sexual Activity   Alcohol use: No   Drug use: No   Sexual activity: Yes

## 2021-01-20 ENCOUNTER — Other Ambulatory Visit: Payer: Self-pay

## 2021-01-20 ENCOUNTER — Encounter: Payer: Self-pay | Admitting: Orthopedic Surgery

## 2021-01-20 ENCOUNTER — Ambulatory Visit (INDEPENDENT_AMBULATORY_CARE_PROVIDER_SITE_OTHER): Payer: 59 | Admitting: Orthopedic Surgery

## 2021-01-20 DIAGNOSIS — M7021 Olecranon bursitis, right elbow: Secondary | ICD-10-CM

## 2021-01-20 NOTE — Progress Notes (Signed)
Post-Op Visit Note   Patient: Rodney Williams           Date of Birth: July 17, 1960           MRN: [id number] Visit Date: 01/20/2021 PCP: Etta Grandchild, MD   Assessment & Plan:  Chief Complaint:  Chief Complaint  Patient presents with   Follow-up   Visit Diagnoses:  1. Olecranon bursitis, right elbow     Plan: Patient is a 60 year old male who presents s/p right olecranon bursa excision on 12/31/2020.  He had rare methicillin sensitive staph aureus on culture results and has now finished his course of antibiotics.  He denies any recurrence of pain in the elbow or any fevers, chills, night sweats, drainage from his incision.  Incision is healing well.  He denies any difficulties and he has returned to his work in full capacity.  He feels 100% better.  He has no axillary lymphadenopathy and no sign of infection or dehiscence around the incision.  Plan for patient to continue back to work in full capacity and follow-up with the office as needed if he has any recurrence of symptoms.  Follow-Up Instructions: No follow-ups on file.   Orders:  No orders of the defined types were placed in this encounter.  No orders of the defined types were placed in this encounter.   Imaging: No results found.  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   PAD (peripheral artery disease) (HCC) 10/28/2012   Routine general medical examination at a health care facility 11/23/2011   Screening, ischemic heart disease 11/23/2011   Past Medical History:  Diagnosis Date   Complication of anesthesia     woke up during surgery   COPD (chronic obstructive pulmonary disease) (HCC)    Olecranon bursitis of right elbow    Tobacco abuse    smoke for 40 years    Family History  Problem Relation Age of Onset   Diabetes Father    Alcohol abuse Neg Hx    Cancer Neg Hx    Early death Neg Hx    Heart disease Neg Hx    Hyperlipidemia Neg Hx    Hypertension Neg Hx    Stroke Neg Hx     Past Surgical History:  Procedure Laterality Date   INGUINAL HERNIA REPAIR     left   OLECRANON BURSECTOMY Right 12/31/2020   Procedure: RIGHT OLECRANON  BURSITIS EXCISION;  Surgeon: Cammy Copa, MD;  Location: [facility];  Service: Orthopedics;  Laterality: Right;   ULNAR NERVE REPAIR     left   ulnar nerve surgery  per pt. 7-8 years ago   Left elbow and wrist   Social History   Occupational History   Occupation: Market researcher: [employer]  Tobacco Use   Smoking status: Some Days    Packs/day: 1.50    Years: 40.00    Pack years: 60.00    Types: Cigarettes   Smokeless tobacco: Never  Vaping Use   Vaping Use: Never used  Substance and Sexual Activity   Alcohol use: No   Drug  use: No   Sexual activity: Yes

## 2021-01-24 ENCOUNTER — Telehealth: Payer: Self-pay | Admitting: Orthopedic Surgery

## 2021-01-24 NOTE — Telephone Encounter (Signed)
Pt called stating he needs a work note releasing him with no restrictions. Pt would like to have this left at the front desk so he can pick it up tomorrow morning. Pt would like a CB when this is ready.   [phone]

## 2021-01-24 NOTE — Telephone Encounter (Signed)
Note put at front desk.

## 2021-01-25 ENCOUNTER — Telehealth: Payer: Self-pay | Admitting: Orthopedic Surgery

## 2021-01-25 NOTE — Telephone Encounter (Signed)
Pt called stating he is suppose to pick up work note. He is asking for it to be faxed instead of pick up. Fax number is [phone]. Attention Baird Lyons

## 2021-01-25 NOTE — Telephone Encounter (Signed)
Faxed

## 2021-01-28 ENCOUNTER — Other Ambulatory Visit: Payer: Self-pay

## 2021-01-28 ENCOUNTER — Ambulatory Visit (INDEPENDENT_AMBULATORY_CARE_PROVIDER_SITE_OTHER): Payer: 59 | Admitting: Orthopedic Surgery

## 2021-01-28 ENCOUNTER — Encounter: Payer: Self-pay | Admitting: Orthopedic Surgery

## 2021-01-28 DIAGNOSIS — M7021 Olecranon bursitis, right elbow: Secondary | ICD-10-CM

## 2021-01-28 MED ORDER — ERYTHROMYCIN BASE 500 MG PO TABS: ORAL_TABLET | ORAL | 0 refills | Status: DC

## 2021-01-28 NOTE — Addendum Note (Signed)
Addended byPrescott Parma on: 01/28/2021 09:27 AM   Modules accepted: Orders

## 2021-01-28 NOTE — Progress Notes (Signed)
Post-Op Visit Note   Patient: Rodney Williams           Date of Birth: 08-Nov-1960           MRN: [id number] Visit Date: 01/28/2021 PCP: Etta Grandchild, MD   Assessment & Plan:  Chief Complaint:  Chief Complaint  Patient presents with   Right Elbow - Pain   Visit Diagnoses:  1. Olecranon bursitis, right elbow     Plan: Rodney Williams is a 60 year old patient who is now several weeks out right elbow olecranon bursectomy.  Patient did have some mildly sensitive staph growth.  Was doing well until this Tuesday when he felt a little bit of pain around the distal aspect of the incision.  On exam today there is little bit of induration but no fluctuance.  Measures about 1-1/2 x 1 cm.  Mildly tender to palpation but full range of motion of the elbow.  Patient has been working.  I do not see anything drainable at this time.  Would favor course of erythromycin for 7 days then return office visit.  Discussed opening up this area and putting a drain in but he can narrow fluctuant area at this time.  Not over the olecranon tip itself but more at the distal aspect of the incision.  We will see where he is at in 7 days.  Follow-Up Instructions: Return in about 1 week (around 02/04/2021).   Orders:  No orders of the defined types were placed in this encounter.  No orders of the defined types were placed in this encounter.   Imaging: No results found.  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   PAD (peripheral artery disease) (HCC) 10/28/2012   Routine general medical examination at a health care  facility 11/23/2011   Screening, ischemic heart disease 11/23/2011   Past Medical History:  Diagnosis Date   Complication of anesthesia    woke up during surgery   COPD (chronic obstructive pulmonary disease) (HCC)    Olecranon bursitis of right elbow    Tobacco abuse    smoke for 40 years    Family History  Problem Relation Age of Onset   Diabetes Father    Alcohol abuse Neg Hx    Cancer Neg Hx    Early death Neg Hx    Heart disease Neg Hx    Hyperlipidemia Neg Hx    Hypertension Neg Hx    Stroke Neg Hx     Past Surgical History:  Procedure Laterality Date   INGUINAL HERNIA REPAIR     left   OLECRANON BURSECTOMY Right 12/31/2020   Procedure: RIGHT OLECRANON  BURSITIS EXCISION;  Surgeon: Cammy Copa, MD;  Location: [facility];  Service: Orthopedics;  Laterality: Right;   ULNAR NERVE REPAIR     left   ulnar nerve surgery  per pt. 7-8 years ago   Left elbow and wrist   Social History   Occupational History   Occupation: Market researcher: [employer]  Tobacco Use   Smoking status: Some Days    Packs/day: 1.50  Years: 40.00    Pack years: 60.00    Types: Cigarettes   Smokeless tobacco: Never  Vaping Use   Vaping Use: Never used  Substance and Sexual Activity   Alcohol use: No   Drug use: No   Sexual activity: Yes

## 2021-02-04 ENCOUNTER — Encounter: Payer: 59 | Admitting: Orthopedic Surgery

## 2021-03-05 ENCOUNTER — Other Ambulatory Visit: Payer: Self-pay | Admitting: Internal Medicine

## 2021-03-05 DIAGNOSIS — J449 Chronic obstructive pulmonary disease, unspecified: Secondary | ICD-10-CM

## 2021-03-05 DIAGNOSIS — J4489 Other specified chronic obstructive pulmonary disease: Secondary | ICD-10-CM

## 2021-03-08 ENCOUNTER — Telehealth: Payer: Self-pay

## 2021-03-08 ENCOUNTER — Other Ambulatory Visit: Payer: Self-pay

## 2021-03-08 DIAGNOSIS — J4489 Other specified chronic obstructive pulmonary disease: Secondary | ICD-10-CM

## 2021-03-08 DIAGNOSIS — J449 Chronic obstructive pulmonary disease, unspecified: Secondary | ICD-10-CM

## 2021-03-08 NOTE — Telephone Encounter (Signed)
Please advise as the pt has stated he needs an emergency inhaler for work as he is only taking the TRELEGY once a day. Pt states during the day when at work he get winded and needs an emergency inhaler for those moments through out the day.

## 2021-03-09 MED ORDER — ALBUTEROL SULFATE HFA 108 (90 BASE) MCG/ACT IN AERS: 2.0000 | INHALATION_SPRAY | Freq: Four times a day (QID) | RESPIRATORY_TRACT | 2 refills | Status: DC | PRN

## 2021-03-09 NOTE — Telephone Encounter (Signed)
Pt has been informed and will follow up to make an OV is he notices his use to be frequent.

## 2021-03-09 NOTE — Telephone Encounter (Signed)
Albuterol inhaler sent to [facility] - if he frequently uses this - consistently more than 2-3 times a week he should f/u with PCP

## 2021-04-15 ENCOUNTER — Other Ambulatory Visit: Payer: Self-pay | Admitting: Internal Medicine

## 2021-07-12 ENCOUNTER — Other Ambulatory Visit: Payer: Self-pay | Admitting: Internal Medicine

## 2021-07-12 DIAGNOSIS — J449 Chronic obstructive pulmonary disease, unspecified: Secondary | ICD-10-CM

## 2021-07-12 DIAGNOSIS — J4489 Other specified chronic obstructive pulmonary disease: Secondary | ICD-10-CM

## 2021-08-10 ENCOUNTER — Other Ambulatory Visit: Payer: Self-pay | Admitting: Internal Medicine

## 2021-08-23 ENCOUNTER — Other Ambulatory Visit: Payer: Self-pay | Admitting: Internal Medicine

## 2021-08-23 DIAGNOSIS — J449 Chronic obstructive pulmonary disease, unspecified: Secondary | ICD-10-CM

## 2021-08-23 DIAGNOSIS — J4489 Other specified chronic obstructive pulmonary disease: Secondary | ICD-10-CM

## 2021-09-27 ENCOUNTER — Other Ambulatory Visit: Payer: Self-pay | Admitting: Internal Medicine

## 2021-09-27 DIAGNOSIS — J449 Chronic obstructive pulmonary disease, unspecified: Secondary | ICD-10-CM

## 2021-09-27 DIAGNOSIS — J4489 Other specified chronic obstructive pulmonary disease: Secondary | ICD-10-CM

## 2021-10-02 ENCOUNTER — Other Ambulatory Visit: Payer: Self-pay | Admitting: Internal Medicine

## 2021-10-02 DIAGNOSIS — J4489 Other specified chronic obstructive pulmonary disease: Secondary | ICD-10-CM

## 2021-10-02 DIAGNOSIS — J449 Chronic obstructive pulmonary disease, unspecified: Secondary | ICD-10-CM

## 2021-10-03 ENCOUNTER — Telehealth: Payer: Self-pay | Admitting: Internal Medicine

## 2021-10-03 NOTE — Telephone Encounter (Signed)
1.Medication Requested: ?TRELEGY ELLIPTA 100-62.5-25 MCG/ACT AEPB ?  ?2. Pharmacy (Name, Street, [place]): ?[id number] - [place], [place] - [address] Phone:  [phone]  ?Fax:  [phone]  ?  ? ?3. On Med List: y ? ?4. Last Visit with PCP: ? ?5. Next visit date with PCP: ? ?Pt states he is completely out of medication and is also requesting that more refills be sent over, as he feels he "has to call and beg for his medication every time." ? ? ?

## 2021-10-04 NOTE — Telephone Encounter (Signed)
Pt scheduled for 4/13 @ 11.20am ?

## 2021-10-13 ENCOUNTER — Ambulatory Visit (INDEPENDENT_AMBULATORY_CARE_PROVIDER_SITE_OTHER): Payer: BC Managed Care – PPO

## 2021-10-13 ENCOUNTER — Ambulatory Visit (INDEPENDENT_AMBULATORY_CARE_PROVIDER_SITE_OTHER): Payer: BC Managed Care – PPO | Admitting: Internal Medicine

## 2021-10-13 ENCOUNTER — Encounter: Payer: Self-pay | Admitting: Internal Medicine

## 2021-10-13 VITALS — BP 116/74 | HR 64 | Temp 98.1°F | Resp 16 | Ht 76.0 in | Wt 220.0 lb

## 2021-10-13 DIAGNOSIS — J449 Chronic obstructive pulmonary disease, unspecified: Secondary | ICD-10-CM

## 2021-10-13 DIAGNOSIS — R058 Other specified cough: Secondary | ICD-10-CM

## 2021-10-13 DIAGNOSIS — Z720 Tobacco use: Secondary | ICD-10-CM

## 2021-10-13 DIAGNOSIS — Z1159 Encounter for screening for other viral diseases: Secondary | ICD-10-CM | POA: Insufficient documentation

## 2021-10-13 DIAGNOSIS — R3129 Other microscopic hematuria: Secondary | ICD-10-CM

## 2021-10-13 DIAGNOSIS — R0609 Other forms of dyspnea: Secondary | ICD-10-CM | POA: Diagnosis not present

## 2021-10-13 DIAGNOSIS — Z1211 Encounter for screening for malignant neoplasm of colon: Secondary | ICD-10-CM | POA: Insufficient documentation

## 2021-10-13 DIAGNOSIS — I739 Peripheral vascular disease, unspecified: Secondary | ICD-10-CM

## 2021-10-13 DIAGNOSIS — E785 Hyperlipidemia, unspecified: Secondary | ICD-10-CM

## 2021-10-13 DIAGNOSIS — Z0000 Encounter for general adult medical examination without abnormal findings: Secondary | ICD-10-CM | POA: Diagnosis not present

## 2021-10-13 DIAGNOSIS — J22 Unspecified acute lower respiratory infection: Secondary | ICD-10-CM | POA: Insufficient documentation

## 2021-10-13 DIAGNOSIS — J4489 Other specified chronic obstructive pulmonary disease: Secondary | ICD-10-CM

## 2021-10-13 LAB — LDL CHOLESTEROL, DIRECT: Direct LDL: 116 mg/dL

## 2021-10-13 LAB — URINALYSIS, ROUTINE W REFLEX MICROSCOPIC
Bilirubin Urine: NEGATIVE
Ketones, ur: NEGATIVE
Leukocytes,Ua: NEGATIVE
Nitrite: NEGATIVE
Specific Gravity, Urine: 1.02 (ref 1.000–1.030)
Total Protein, Urine: NEGATIVE
Urine Glucose: NEGATIVE
Urobilinogen, UA: 0.2 (ref 0.0–1.0)
pH: 6 (ref 5.0–8.0)

## 2021-10-13 LAB — HEPATIC FUNCTION PANEL
ALT: 16 U/L (ref 0–53)
AST: 14 U/L (ref 0–37)
Albumin: 4.4 g/dL (ref 3.5–5.2)
Alkaline Phosphatase: 65 U/L (ref 39–117)
Bilirubin, Direct: 0.1 mg/dL (ref 0.0–0.3)
Total Bilirubin: 0.4 mg/dL (ref 0.2–1.2)
Total Protein: 6.8 g/dL (ref 6.0–8.3)

## 2021-10-13 LAB — LIPID PANEL
Cholesterol: 167 mg/dL (ref 0–200)
HDL: 34.2 mg/dL — ABNORMAL LOW (ref >39.00)
NonHDL: 133.1
Total CHOL/HDL Ratio: 5
Triglycerides: 268 mg/dL — ABNORMAL HIGH (ref 0.0–149.0)
VLDL: 53.6 mg/dL — ABNORMAL HIGH (ref 0.0–40.0)

## 2021-10-13 LAB — TROPONIN I (HIGH SENSITIVITY): High Sens Troponin I: 3 ng/L (ref 2–17)

## 2021-10-13 LAB — TSH: TSH: 2.85 u[IU]/mL (ref 0.35–5.50)

## 2021-10-13 LAB — BRAIN NATRIURETIC PEPTIDE: Pro B Natriuretic peptide (BNP): 11 pg/mL (ref 0.0–100.0)

## 2021-10-13 LAB — PSA: PSA: 1.37 ng/mL (ref 0.10–4.00)

## 2021-10-13 MED ORDER — TRELEGY ELLIPTA 100-62.5-25 MCG/ACT IN AEPB: 1.0000 | INHALATION_SPRAY | Freq: Every day | RESPIRATORY_TRACT | 1 refills | Status: DC

## 2021-10-13 MED ORDER — ROSUVASTATIN CALCIUM 20 MG PO TABS: 20.0000 mg | ORAL_TABLET | Freq: Every day | ORAL | 1 refills | Status: DC

## 2021-10-13 MED ORDER — AMOXICILLIN-POT CLAVULANATE 875-125 MG PO TABS: 1.0000 | ORAL_TABLET | Freq: Two times a day (BID) | ORAL | 0 refills | Status: AC

## 2021-10-13 MED ORDER — ASPIRIN EC 81 MG PO TBEC: 81.0000 mg | DELAYED_RELEASE_TABLET | Freq: Every day | ORAL | 1 refills | Status: DC

## 2021-10-13 NOTE — Progress Notes (Signed)
? ?Subjective:  ?Patient ID: Rodney Williams, male    DOB: 10-25-1960  Age: 61 y.o. MRN: [id number] ? ?CC: Annual Exam, Hyperlipidemia, and COPD ? ? ?HPI ?Rodney Williams presents for a CPX and f/up -  ? ?2 weeks ago he developed cough, low-grade fever, chills, night sweats, and shortness of breath.  The cough persists and is productive of yellow phlegm.  He has dyspnea on exertion and the cough produces chest pain but he denies pleuritic chest pain or chest pain with exertion. ? ?Outpatient Medications Prior to Visit  ?Medication Sig Dispense Refill  ? albuterol (VENTOLIN HFA) 108 (90 Base) MCG/ACT inhaler TAKE 2 PUFFS BY MOUTH EVERY 6 HOURS AS NEEDED FOR WHEEZE OR SHORTNESS OF BREATH 8.5 each 2  ? cephALEXin (KEFLEX) 500 MG capsule Take one capsule every 8 hours x 7 days. 21 capsule 0  ? erythromycin base (E-MYCIN) 500 MG tablet 1 po q 12 hrs x 7 days 14 tablet 0  ? oxyCODONE (ROXICODONE) 5 MG immediate release tablet Take 1 tablet (5 mg total) by mouth every 4 (four) hours as needed for severe pain. 30 tablet 0  ? TRELEGY ELLIPTA 100-62.5-25 MCG/ACT AEPB INHALE 1 PUFF BY MOUTH EVERY DAY 60 each 0  ? ?No facility-administered medications prior to visit.  ? ? ?ROS ?Review of Systems  ?Constitutional:  Negative for appetite change, chills, diaphoresis, fatigue and fever.  ?HENT: Negative.  Negative for sinus pressure, sore throat, tinnitus and trouble swallowing.   ?Eyes: Negative.   ?Respiratory:  Positive for cough and shortness of breath. Negative for chest tightness and wheezing.   ?Cardiovascular:  Positive for chest pain. Negative for palpitations and leg swelling.  ?Gastrointestinal:  Negative for abdominal pain, blood in stool, constipation, diarrhea, nausea and vomiting.  ?Endocrine: Negative.   ?Genitourinary: Negative.   ?Musculoskeletal: Negative.  Negative for myalgias.  ?Skin: Negative.   ?Neurological: Negative.   ?Hematological:  Negative for adenopathy. Does not bruise/bleed easily.   ?Psychiatric/Behavioral: Negative.    ? ?Objective:  ?BP 116/74 (BP Location: Left Arm, Patient Position: Sitting, Cuff Size: Large)   Pulse 64   Temp 98.1 ?F (36.7 ?C) (Oral)   Resp 16   Ht 6\' 4"  (1.93 m)   Wt 220 lb (99.8 kg)   SpO2 93%   BMI 26.78 kg/m?  ? ?BP Readings from Last 3 Encounters:  ?10/13/21 116/74  ?12/31/20 129/84  ?11/25/20 118/76  ? ? ?Wt Readings from Last 3 Encounters:  ?10/13/21 220 lb (99.8 kg)  ?12/31/20 217 lb 13 oz (98.8 kg)  ?11/25/20 217 lb (98.4 kg)  ? ? ?Physical Exam ?Vitals reviewed.  ?Constitutional:   ?   General: He is not in acute distress. ?   Appearance: He is not ill-appearing, toxic-appearing or diaphoretic.  ?HENT:  ?   Nose: Nose normal.  ?   Mouth/Throat:  ?   Mouth: Mucous membranes are moist.  ?Eyes:  ?   General: No scleral icterus. ?   Conjunctiva/sclera: Conjunctivae normal.  ?Cardiovascular:  ?   Rate and Rhythm: Normal rate and regular rhythm.  ?   Pulses:     ?     Carotid pulses are 1+ on the right side and 1+ on the left side. ?     Radial pulses are 1+ on the right side and 1+ on the left side.  ?     Femoral pulses are 1+ on the right side and 1+ on the left side. ?  Popliteal pulses are 1+ on the right side and 1+ on the left side.  ?     Dorsalis pedis pulses are 1+ on the right side and 1+ on the left side.  ?     Posterior tibial pulses are 0 on the right side and 0 on the left side.  ?   Heart sounds: Normal heart sounds, S1 normal and S2 normal. No murmur heard. ?  No friction rub. No gallop.  ?   Comments: EKG- ?NSR, 96 bpm ?Normal EKG ?Pulmonary:  ?   Effort: Pulmonary effort is normal.  ?   Breath sounds: No stridor. No wheezing, rhonchi or rales.  ?Abdominal:  ?   General: Abdomen is flat.  ?   Palpations: There is no mass.  ?   Tenderness: There is no abdominal tenderness. There is no guarding.  ?   Hernia: No hernia is present. There is no hernia in the left inguinal area or right inguinal area.  ?Genitourinary: ?   Pubic Area: No rash.    ?   Penis: Normal and circumcised.   ?   Testes: Normal.  ?   Epididymis:  ?   Right: Normal.  ?   Left: Normal.  ?   Prostate: Normal. Not enlarged, not tender and no nodules present.  ?   Rectum: Guaiac result negative. External hemorrhoid and internal hemorrhoid present. No mass, tenderness or anal fissure. Normal anal tone.  ?Musculoskeletal:  ?   Cervical back: Neck supple.  ?   Right lower leg: No edema.  ?   Left lower leg: No edema.  ?Lymphadenopathy:  ?   Cervical: No cervical adenopathy.  ?   Lower Body: No right inguinal adenopathy. No left inguinal adenopathy.  ?Skin: ?   General: Skin is warm and dry.  ?Neurological:  ?   General: No focal deficit present.  ?   Mental Status: He is alert. Mental status is at baseline.  ?Psychiatric:     ?   Mood and Affect: Mood normal.     ?   Behavior: Behavior normal.  ? ? ?Lab Results  ?Component Value Date  ? WBC 6.7 12/30/2020  ? HGB 17.5 (H) 12/30/2020  ? HCT 51.7 12/30/2020  ? PLT 207 12/30/2020  ? GLUCOSE 102 (H) 12/30/2020  ? CHOL 167 10/13/2021  ? TRIG 268.0 (H) 10/13/2021  ? HDL 34.20 (L) 10/13/2021  ? LDLDIRECT 116.0 10/13/2021  ? ALT 16 10/13/2021  ? AST 14 10/13/2021  ? NA 136 12/30/2020  ? K 4.6 12/30/2020  ? CL 99 12/30/2020  ? CREATININE 1.44 (H) 12/30/2020  ? BUN 15 12/30/2020  ? CO2 27 12/30/2020  ? TSH 2.85 10/13/2021  ? PSA 1.37 10/13/2021  ? HGBA1C 5.8 11/23/2011  ? ? ?DG Chest 2 View ? ?Result Date: 10/13/2021 ?CLINICAL DATA:  Cough for 2 weeks. Productive cough and dyspnea on exertion. EXAM: CHEST - 2 VIEW COMPARISON:  Chest two views 05/01/2017 FINDINGS: Cardiac silhouette and mediastinal contours are within normal limits. Flattening of the diaphragms and moderate hyperinflation, unchanged. The lungs are clear. No pleural effusion or pneumothorax. No acute skeletal abnormality. IMPRESSION: No active cardiopulmonary disease. Electronically Signed   By: Yvonne Kendall M.D.   On: 10/13/2021 12:12    ? ?IMPRESSION: ?1. Lung-RADS 1, negative.  Continue annual screening with low-dose ?chest CT without contrast in 12 months. ?2. Aortic atherosclerosis. ?3. Mild diffuse bronchial wall thickening with mild centrilobular ?and paraseptal emphysema; imaging findings suggestive  of underlying ?COPD. ?  ?Aortic Atherosclerosis (ICD10-I70.0) and Emphysema (ICD10-J43.9). ?  ?  ?Electronically Signed ?  By: Vinnie Langton M.D. ?  On: 11/22/2020 10:05 ? ? ?Assessment & Plan:  ? ?Sye was seen today for annual exam, hyperlipidemia and copd. ? ?Diagnoses and all orders for this visit: ? ?Routine general medical examination at a health care facility- Exam completed, labs reviewed, he refused all vaccines today, cancer screenings addressed, patient education was given. ?-     Lipid panel; Future ?-     PSA; Future ?-     Hepatitis C antibody; Future ?-     HIV Antibody (routine testing w rflx); Future ?-     HIV Antibody (routine testing w rflx) ?-     Hepatitis C antibody ?-     PSA ?-     Lipid panel ? ?Hyperlipidemia with target low density lipoprotein (LDL) cholesterol less than 70 mg/dL- I recommended he take a statin for cardiovascular risk reduction. ?-     Hepatic function panel; Future ?-     TSH; Future ?-     TSH ?-     Hepatic function panel ? ?Tobacco abuse ? ?COPD (chronic obstructive pulmonary disease) with chronic bronchitis ([place])- He is doing well on the LAMA/LABA/ICS inhaler. ?-     Fluticasone-Umeclidin-Vilant (TRELEGY ELLIPTA) 100-62.5-25 MCG/ACT AEPB; Inhale 1 Act into the lungs daily. ? ?Need for hepatitis C screening test ?-     Hepatitis C antibody; Future ?-     Hepatitis C antibody ? ?Hematuria, microscopic- He still have a few red cells in his urine but this has been stable for years and is therefore considered benign. ?-     Urinalysis, Routine w reflex microscopic; Future ?-     Urinalysis, Routine w reflex microscopic ? ?Cough productive of purulent sputum- His chest x-ray is negative for mass or infiltrate. ?-     DG Chest 2 View;  Future ? ?Screen for colon cancer ?-     Ambulatory referral to Gastroenterology ? ?DOE (dyspnea on exertion)- A CT scan done nearly a year ago did not show any atherosclerosis.  His EKG today is normal and his enzymes are reassuring.  I

## 2021-10-13 NOTE — Patient Instructions (Signed)

## 2021-10-14 LAB — HEPATITIS C ANTIBODY
Hepatitis C Ab: NONREACTIVE
SIGNAL TO CUT-OFF: 0.06 (ref <1.00)

## 2021-10-14 LAB — HIV ANTIBODY (ROUTINE TESTING W REFLEX): HIV 1&2 Ab, 4th Generation: NONREACTIVE

## 2021-11-18 ENCOUNTER — Ambulatory Visit (INDEPENDENT_AMBULATORY_CARE_PROVIDER_SITE_OTHER)
Admission: RE | Admit: 2021-11-18 | Discharge: 2021-11-18 | Disposition: A | Discharge status: Home / Self Care | Payer: BC Managed Care – PPO | Source: Ambulatory Visit | Attending: Acute Care | Admitting: Acute Care

## 2021-11-18 DIAGNOSIS — F17200 Nicotine dependence, unspecified, uncomplicated: Secondary | ICD-10-CM

## 2021-11-18 DIAGNOSIS — Z87891 Personal history of nicotine dependence: Secondary | ICD-10-CM

## 2021-11-21 ENCOUNTER — Other Ambulatory Visit: Payer: Self-pay | Admitting: Acute Care

## 2021-11-21 DIAGNOSIS — Z122 Encounter for screening for malignant neoplasm of respiratory organs: Secondary | ICD-10-CM

## 2021-11-21 DIAGNOSIS — Z87891 Personal history of nicotine dependence: Secondary | ICD-10-CM

## 2021-11-21 DIAGNOSIS — F17210 Nicotine dependence, cigarettes, uncomplicated: Secondary | ICD-10-CM

## 2022-01-16 ENCOUNTER — Other Ambulatory Visit: Payer: Self-pay | Admitting: Internal Medicine

## 2022-01-16 ENCOUNTER — Telehealth: Payer: Self-pay | Admitting: Internal Medicine

## 2022-01-16 DIAGNOSIS — J449 Chronic obstructive pulmonary disease, unspecified: Secondary | ICD-10-CM

## 2022-01-16 DIAGNOSIS — J4489 Other specified chronic obstructive pulmonary disease: Secondary | ICD-10-CM

## 2022-01-16 MED ORDER — UMECLIDINIUM-VILANTEROL 62.5-25 MCG/ACT IN AEPB: 1.0000 | INHALATION_SPRAY | Freq: Every day | RESPIRATORY_TRACT | 1 refills | Status: DC

## 2022-01-16 NOTE — Telephone Encounter (Signed)
Pt stated his insurance is not covering Fluticasone-Umeclidin-Vilant (TRELEGY ELLIPTA) 100-62.5-25 MCG/ACT AEPB. The rx will cost him $500.   [employer] advised pt they will cover Anoro inhaler. Pt would like an rx for Anoro sent to    [id number] - [place], [place] - [address] Phone:  [phone]  Fax:  [phone]

## 2022-02-12 ENCOUNTER — Other Ambulatory Visit: Payer: Self-pay | Admitting: Internal Medicine

## 2022-04-13 ENCOUNTER — Encounter: Payer: Self-pay | Admitting: Internal Medicine

## 2022-04-13 ENCOUNTER — Ambulatory Visit (INDEPENDENT_AMBULATORY_CARE_PROVIDER_SITE_OTHER): Payer: Commercial Managed Care - HMO | Admitting: Internal Medicine

## 2022-04-13 ENCOUNTER — Ambulatory Visit (INDEPENDENT_AMBULATORY_CARE_PROVIDER_SITE_OTHER): Payer: Commercial Managed Care - HMO

## 2022-04-13 ENCOUNTER — Telehealth: Payer: Self-pay | Admitting: *Deleted

## 2022-04-13 VITALS — BP 136/86 | HR 90 | Temp 97.7°F | Ht 76.0 in | Wt 227.0 lb

## 2022-04-13 DIAGNOSIS — M25512 Pain in left shoulder: Secondary | ICD-10-CM

## 2022-04-13 DIAGNOSIS — D751 Secondary polycythemia: Secondary | ICD-10-CM

## 2022-04-13 DIAGNOSIS — G8929 Other chronic pain: Secondary | ICD-10-CM

## 2022-04-13 DIAGNOSIS — Z1211 Encounter for screening for malignant neoplasm of colon: Secondary | ICD-10-CM

## 2022-04-13 DIAGNOSIS — M19012 Primary osteoarthritis, left shoulder: Secondary | ICD-10-CM

## 2022-04-13 DIAGNOSIS — J4489 Other specified chronic obstructive pulmonary disease: Secondary | ICD-10-CM

## 2022-04-13 DIAGNOSIS — Z720 Tobacco use: Secondary | ICD-10-CM | POA: Diagnosis not present

## 2022-04-13 LAB — CBC WITH DIFFERENTIAL/PLATELET
Basophils Absolute: 0 10*3/uL (ref 0.0–0.1)
Basophils Relative: 0.4 % (ref 0.0–3.0)
Eosinophils Absolute: 0.3 10*3/uL (ref 0.0–0.7)
Eosinophils Relative: 5.3 % — ABNORMAL HIGH (ref 0.0–5.0)
HCT: 55 % — ABNORMAL HIGH (ref 39.0–52.0)
Hemoglobin: 18.6 g/dL (ref 13.0–17.0)
Lymphocytes Relative: 41.1 % (ref 12.0–46.0)
Lymphs Abs: 2.4 10*3/uL (ref 0.7–4.0)
MCHC: 33.9 g/dL (ref 30.0–36.0)
MCV: 89.3 fl (ref 78.0–100.0)
Monocytes Absolute: 0.5 10*3/uL (ref 0.1–1.0)
Monocytes Relative: 8.4 % (ref 3.0–12.0)
Neutro Abs: 2.6 10*3/uL (ref 1.4–7.7)
Neutrophils Relative %: 44.8 % (ref 43.0–77.0)
Platelets: 189 10*3/uL (ref 150.0–400.0)
RBC: 6.16 Mil/uL — ABNORMAL HIGH (ref 4.22–5.81)
RDW: 15.1 % (ref 11.5–15.5)
WBC: 5.8 10*3/uL (ref 4.0–10.5)

## 2022-04-13 LAB — BASIC METABOLIC PANEL
BUN: 14 mg/dL (ref 6–23)
CO2: 33 mEq/L — ABNORMAL HIGH (ref 19–32)
Calcium: 9.5 mg/dL (ref 8.4–10.5)
Chloride: 99 mEq/L (ref 96–112)
Creatinine, Ser: 1.26 mg/dL (ref 0.40–1.50)
GFR: 61.93 mL/min (ref >60.00)
Glucose, Bld: 80 mg/dL (ref 70–99)
Potassium: 3.9 mEq/L (ref 3.5–5.1)
Sodium: 138 mEq/L (ref 135–145)

## 2022-04-13 MED ORDER — NICOTINE 14 MG/24HR TD PT24: 14.0000 mg | MEDICATED_PATCH | Freq: Every day | TRANSDERMAL | 0 refills | Status: DC

## 2022-04-13 MED ORDER — NICOTINE 21 MG/24HR TD PT24: 21.0000 mg | MEDICATED_PATCH | Freq: Every day | TRANSDERMAL | 0 refills | Status: DC

## 2022-04-13 MED ORDER — NICOTINE 7 MG/24HR TD PT24: 7.0000 mg | MEDICATED_PATCH | Freq: Every day | TRANSDERMAL | 0 refills | Status: DC

## 2022-04-13 NOTE — Patient Instructions (Signed)

## 2022-04-13 NOTE — Progress Notes (Signed)
erythro  Subjective:  Patient ID: Rodney Williams, male    DOB: 06/01/1961  Age: 61 y.o. MRN: [id number]  CC: COPD   HPI Rodney Williams presents for f/up -  He complains of a 2-week history of left shoulder pain that he describes as a burning sensation with decreased range of motion.  He denies T/I. He says the pain keeps him awake at night.  He is not taking anything for the pain.  He is active and denies chest pain, shortness of breath, diaphoresis, or edema.  He refused all vaccines today. He is ready to quit smoking.  Outpatient Medications Prior to Visit  Medication Sig Dispense Refill   albuterol (VENTOLIN HFA) 108 (90 Base) MCG/ACT inhaler TAKE 2 PUFFS BY MOUTH EVERY 6 HOURS AS NEEDED FOR WHEEZE OR SHORTNESS OF BREATH 8.5 each 2   aspirin EC 81 MG tablet Take 1 tablet (81 mg total) by mouth daily. 90 tablet 1   rosuvastatin (CRESTOR) 20 MG tablet Take 1 tablet (20 mg total) by mouth daily. 90 tablet 1   umeclidinium-vilanterol (ANORO ELLIPTA) 62.5-25 MCG/ACT AEPB Inhale 1 puff into the lungs daily at 6 (six) AM. 120 each 1   No facility-administered medications prior to visit.    ROS Review of Systems  Constitutional: Negative.  Negative for diaphoresis and fatigue.  HENT: Negative.    Eyes: Negative.   Respiratory:  Negative for cough, chest tightness, shortness of breath and wheezing.   Cardiovascular:  Negative for chest pain, palpitations and leg swelling.  Gastrointestinal:  Negative for abdominal pain, constipation and diarrhea.  Endocrine: Negative.   Genitourinary: Negative.  Negative for difficulty urinating.  Musculoskeletal:  Positive for arthralgias. Negative for back pain and myalgias.  Skin: Negative.   Neurological:  Negative for dizziness and weakness.  Hematological:  Negative for adenopathy. Does not bruise/bleed easily.  Psychiatric/Behavioral: Negative.     Objective:  BP 136/86 (BP Location: Left Arm, Patient Position: Sitting, Cuff Size: Large)    Pulse 90   Temp 97.7 F (36.5 C) (Oral)   Ht 6\' 4"  (1.93 m)   Wt 227 lb (103 kg)   SpO2 92%   BMI 27.63 kg/m   BP Readings from Last 3 Encounters:  04/13/22 136/86  10/13/21 116/74  12/31/20 129/84    Wt Readings from Last 3 Encounters:  04/13/22 227 lb (103 kg)  10/13/21 220 lb (99.8 kg)  12/31/20 217 lb 13 oz (98.8 kg)    Physical Exam Vitals reviewed.  HENT:     Nose: Nose normal.     Mouth/Throat:     Mouth: Mucous membranes are moist.  Eyes:     General: No scleral icterus.    Conjunctiva/sclera: Conjunctivae normal.  Cardiovascular:     Rate and Rhythm: Normal rate and regular rhythm.     Heart sounds: No murmur heard. Pulmonary:     Effort: Pulmonary effort is normal.     Breath sounds: No stridor. No wheezing, rhonchi or rales.  Abdominal:     General: Abdomen is flat.     Palpations: There is no mass.     Tenderness: There is no abdominal tenderness. There is no guarding.     Hernia: No hernia is present.  Musculoskeletal:        General: Normal range of motion.     Right shoulder: Normal.     Left shoulder: Bony tenderness present. No swelling, deformity, effusion, tenderness or crepitus.     Cervical back: Neck  supple.     Right lower leg: No edema.     Left lower leg: No edema.  Lymphadenopathy:     Cervical: No cervical adenopathy.  Skin:    General: Skin is warm and dry.  Neurological:     General: No focal deficit present.     Mental Status: He is alert.  Psychiatric:        Mood and Affect: Mood normal.        Behavior: Behavior normal.     Lab Results  Component Value Date   WBC 5.8 04/13/2022   HGB 18.6 Repeated and verified X2. (HH) 04/13/2022   HCT 55.0 (H) 04/13/2022   PLT 189.0 04/13/2022   GLUCOSE 80 04/13/2022   CHOL 167 10/13/2021   TRIG 268.0 (H) 10/13/2021   HDL 34.20 (L) 10/13/2021   LDLDIRECT 116.0 10/13/2021   ALT 16 10/13/2021   AST 14 10/13/2021   NA 138 04/13/2022   K 3.9 04/13/2022   CL 99 04/13/2022    CREATININE 1.26 04/13/2022   BUN 14 04/13/2022   CO2 33 (H) 04/13/2022   TSH 2.85 10/13/2021   PSA 1.37 10/13/2021   HGBA1C 5.8 11/23/2011    CT CHEST LUNG CA SCREEN LOW DOSE W/O CM  Result Date: 11/19/2021 CLINICAL DATA:  Lung cancer screening. Sixty-nine pack-year history. Current asymptomatic smoker. EXAM: CT CHEST WITHOUT CONTRAST LOW-DOSE FOR LUNG CANCER SCREENING TECHNIQUE: Multidetector CT imaging of the chest was performed following the standard protocol without IV contrast. RADIATION DOSE REDUCTION: This exam was performed according to the departmental dose-optimization program which includes automated exposure control, adjustment of the mA and/or kV according to patient size and/or use of iterative reconstruction technique. COMPARISON:  11/19/2020 FINDINGS: Cardiovascular: Heart size normal. No pericardial effusion. Aortic atherosclerosis. Mediastinum/Nodes: No enlarged mediastinal, hilar, or axillary lymph nodes. Thyroid gland, trachea, and esophagus demonstrate no significant findings. Lungs/Pleura: Centrilobular and paraseptal emphysema. No pleural effusion, airspace consolidation, or atelectasis. Unchanged appearance of right upper lobe calcified granuloma and perifissural nodule within the right midlung which has a mean derived diameter of 5.3 mm. No new or suspicious lung nodules identified. Upper Abdomen: No acute abnormality. Musculoskeletal: No acute or suspicious osseous findings. Bilateral gynecomastia. IMPRESSION: 1. Lung-RADS 2, benign appearance or behavior. Continue annual screening with low-dose chest CT without contrast in 12 months. 2. Aortic Atherosclerosis (ICD10-I70.0) and Emphysema (ICD10-J43.9). Electronically Signed   By: Signa Kell M.D.   On: 11/19/2021 16:49  DG Shoulder Left  Result Date: 04/14/2022 CLINICAL DATA:  Chronic left shoulder pain for 2 months EXAM: LEFT SHOULDER - 2+ VIEW COMPARISON:  None Available. FINDINGS: There is no evidence of fracture or  dislocation. Mild degenerative joint changes of left acromioclavicular joint noted. Soft tissues are unremarkable. IMPRESSION: Mild degenerative joint changes of left acromioclavicular joint. Electronically Signed   By: Sherian Rein M.D.   On: 04/14/2022 17:37     Assessment & Plan:   [place] was seen today for copd.  Diagnoses and all orders for this visit:  Chronic left shoulder pain- Will treat for OA with a COX II inhibitor. -     DG Shoulder Left; Future -     Cancel: Cologuard -     Basic metabolic panel; Future -     CBC with Differential/Platelet; Future -     CBC with Differential/Platelet -     Basic metabolic panel -     Ambulatory referral to Orthopedic Surgery  Screen for colon cancer -  Cologuard  COPD (chronic obstructive pulmonary disease) with chronic bronchitis- He is ready to quit smoking. -     Basic metabolic panel; Future -     CBC with Differential/Platelet; Future -     CBC with Differential/Platelet -     Basic metabolic panel  Tobacco abuse -     nicotine (NICODERM CQ) 7 mg/24hr patch; Place 1 patch (7 mg total) onto the skin daily. -     nicotine (NICODERM CQ) 14 mg/24hr patch; Place 1 patch (14 mg total) onto the skin daily. -     nicotine (NICODERM CQ) 21 mg/24hr patch; Place 1 patch (21 mg total) onto the skin daily.  Erythrocytosis -     Ambulatory referral to Hematology / Oncology  Primary osteoarthritis, left shoulder -     Ambulatory referral to Orthopedic Surgery -     celecoxib (CELEBREX) 50 MG capsule; Take 1 capsule (50 mg total) by mouth 2 (two) times daily.  Arthritis of left acromioclavicular joint -     celecoxib (CELEBREX) 50 MG capsule; Take 1 capsule (50 mg total) by mouth 2 (two) times daily.   I am having Rodney Williams "BILLY" start on nicotine, nicotine, nicotine, and celecoxib. I am also having him maintain his rosuvastatin, aspirin EC, umeclidinium-vilanterol, and albuterol.  Meds ordered this encounter  Medications    nicotine (NICODERM CQ) 7 mg/24hr patch    Sig: Place 1 patch (7 mg total) onto the skin daily.    Dispense:  28 patch    Refill:  0   nicotine (NICODERM CQ) 14 mg/24hr patch    Sig: Place 1 patch (14 mg total) onto the skin daily.    Dispense:  28 patch    Refill:  0   nicotine (NICODERM CQ) 21 mg/24hr patch    Sig: Place 1 patch (21 mg total) onto the skin daily.    Dispense:  28 patch    Refill:  0   celecoxib (CELEBREX) 50 MG capsule    Sig: Take 1 capsule (50 mg total) by mouth 2 (two) times daily.    Dispense:  180 capsule    Refill:  0     Follow-up: Return in about 3 months (around 07/14/2022).  Scarlette Calico, MD

## 2022-04-13 NOTE — Telephone Encounter (Signed)
Critical lab Santiago Glad) called and stated --Hemoglobin 18.6.

## 2022-04-14 ENCOUNTER — Telehealth: Payer: Self-pay | Admitting: Hematology and Oncology

## 2022-04-14 NOTE — Telephone Encounter (Signed)
Spoke to pt stated--have appt. To hematology Nov 2.

## 2022-04-14 NOTE — Telephone Encounter (Signed)
Scheduled appt per 10/12 referral. Pt is aware of appt date and time. Pt is aware to arrive 15 mins prior to appt time and to bring and updated insurance card. Pt is aware of appt location.   

## 2022-04-15 DIAGNOSIS — M19012 Primary osteoarthritis, left shoulder: Secondary | ICD-10-CM | POA: Insufficient documentation

## 2022-04-18 DIAGNOSIS — M19012 Primary osteoarthritis, left shoulder: Secondary | ICD-10-CM | POA: Insufficient documentation

## 2022-04-18 MED ORDER — CELECOXIB 50 MG PO CAPS
50.0000 mg | ORAL_CAPSULE | Freq: Two times a day (BID) | ORAL | 0 refills | Status: DC
Start: 2022-04-18 — End: 2022-05-04

## 2022-04-28 ENCOUNTER — Ambulatory Visit: Payer: Self-pay

## 2022-04-28 ENCOUNTER — Ambulatory Visit: Payer: Commercial Managed Care - HMO | Admitting: Surgical

## 2022-04-28 ENCOUNTER — Ambulatory Visit (INDEPENDENT_AMBULATORY_CARE_PROVIDER_SITE_OTHER): Payer: Commercial Managed Care - HMO

## 2022-04-28 DIAGNOSIS — M25512 Pain in left shoulder: Secondary | ICD-10-CM | POA: Diagnosis not present

## 2022-04-28 DIAGNOSIS — M19012 Primary osteoarthritis, left shoulder: Secondary | ICD-10-CM

## 2022-04-29 ENCOUNTER — Encounter: Payer: Self-pay | Admitting: Orthopedic Surgery

## 2022-04-30 ENCOUNTER — Encounter: Payer: Self-pay | Admitting: Orthopedic Surgery

## 2022-04-30 MED ORDER — BUPIVACAINE HCL 0.5 % IJ SOLN
9.0000 mL | INTRAMUSCULAR | Status: AC | PRN
  Administered 2022-04-28: 9 mL via INTRA_ARTICULAR

## 2022-04-30 MED ORDER — METHYLPREDNISOLONE ACETATE 40 MG/ML IJ SUSP
40.0000 mg | INTRAMUSCULAR | Status: AC | PRN
  Administered 2022-04-28: 40 mg via INTRA_ARTICULAR

## 2022-04-30 MED ORDER — LIDOCAINE HCL 1 % IJ SOLN
5.0000 mL | INTRAMUSCULAR | Status: AC | PRN
  Administered 2022-04-28: 5 mL

## 2022-04-30 NOTE — Progress Notes (Signed)
Office Visit Note   Patient: Rodney Williams           Date of Birth: March 18, 1961           MRN: [id number] Visit Date: 04/28/2022 Requested by: Etta Grandchild, MD [address] [place],  [place] [address] PCP: Etta Grandchild, MD  Subjective: Chief Complaint  Patient presents with   Left Shoulder - Pain    HPI: Rodney Williams is a 61 y.o. male who presents to the office reporting left shoulder pain.  Patient states that last week he was loading a lawnmower onto a truck with another person and has had progressive left shoulder pain since that event.  Localizes pain to the anterolateral aspect of the shoulder with radiation to the elbow and no radiation past the elbow.  Wakes with pain pretty much every night with associated scapular pain and occasional numbness and tingling.  He is right-hand dominant.  Notes occasional burning and stabbing sensation in the shoulder.  Pain is worse with stretching overhead.  No history of prior shoulder or neck surgeries.  Not really take anything for pain.  No history of diabetes or blood thinner use.  He is a smoker but he is trying to quit with nicotine patch.  Does not really feel any weakness in the shoulder but just feels like pain is holding him back from lifting..                ROS: All systems reviewed are negative as they relate to the chief complaint within the history of present illness.  Patient denies fevers or chills.  Assessment & Plan: Visit Diagnoses:  1. Arthritis of left shoulder region   2. Acute pain of left shoulder     Plan: Patient is a 61 year old male who presents for evaluation of left shoulder pain.  He has increased left shoulder pain since helping another person load a lawnmower up onto a truck last week.  Most of the pain is in the anterolateral aspect of the shoulder with radiation to the elbow.  Pain is waking him up at night.  No real rotator cuff weakness aside from a little bit of asymmetric external rotation  weakness noted at 5 -/5.  He has radiographs of the left shoulder taken about 2 weeks ago demonstrating some mild degenerative changes of the glenohumeral joint with early osteophytic lipping of the inferior humeral head and mild joint space narrowing of the glenohumeral joint.  This correlates with loss of passive motion of the left shoulder relative to the right.  Also has radiographs of the cervical spine taken today demonstrating degenerative changes primarily at C5-C6 and C6-C7.  Most of his pain seems to be reproduced with shoulder range of motion and with tenderness over the bicipital groove.  He has ultrasound examination today that demonstrated fluid in the bicep tendon sheath.  After discussion of options, patient would like to try shoulder injection; glenohumeral injection was successfully delivered into the joint with the use of ultrasound guidance.  He will call the office in 2 weeks and if he has no sustained relief of symptoms, plan for further evaluation with cervical spine versus left shoulder MRI.  With the burning sensation and associated scapular pain and numbness/tingling as well as the fluid in the bicep tendon sheath and reproduction with shoulder range of motion, think that it would be best to get both MRIs at that time if he is still symptomatic.  Follow-Up Instructions: No  follow-ups on file.   Orders:  Orders Placed This Encounter  Procedures   XR Cervical Spine 2 or 3 views   US Guided Needle Placement - No Linked Charges   No orders of the defined types were placed in this encounter.     Procedures: Large Joint Inj: L glenohumeral on 04/28/2022 2:04 PM Indications: diagnostic evaluation and pain Details: 22 G 1.5 in and 3.5 in needle, ultrasound-guided posterior approach  Arthrogram: No  Medications: 9 mL bupivacaine 0.5 %; 40 mg methylPREDNISolone acetate 40 MG/ML; 5 mL lidocaine 1 % Outcome: tolerated well, no immediate complications Procedure, treatment  alternatives, risks and benefits explained, specific risks discussed. Consent was given by the patient. Immediately prior to procedure a time out was called to verify the correct patient, procedure, equipment, support staff and site/side marked as required. Patient was prepped and draped in the usual sterile fashion.       Clinical Data: No additional findings.  Objective: Vital Signs: There were no vitals taken for this visit.  Physical Exam:  Constitutional: Patient appears well-developed HEENT:  Head: Normocephalic Eyes:EOM are normal Neck: Normal range of motion Cardiovascular: Normal rate Pulmonary/chest: Effort normal Neurologic: Patient is alert Skin: Skin is warm Psychiatric: Patient has normal mood and affect  Ortho Exam: Ortho exam demonstrates left shoulder with 35 degrees X rotation, 75 degrees abduction, 155 degrees forward flexion passively.  This compared with the right shoulder 45 degrees X rotation, 100 degrees abduction, 180 degrees forward flexion passively.  He has excellent rotator cuff strength of supra, subscap bilaterally with 5 -/5 interest Natus strength on the left versus 5/5 strength on the right.  Negative Hornblower sign.  Negative external rotation lag sign.  Negative belly press test.  Negative liftoff test.  Tender to palpation over the bicipital groove.  Positive O'Brien sign.  No tenderness over the [facility] joint bilaterally.  There is no ecchymosis or skin changes noted.  +5 motor strength of bilateral grip strength, finger abduction, pronation/supination, bicep, tricep, deltoid.  Negative Lhermitte sign.  Negative Spurling sign.  Specialty Comments:  No specialty comments available.  Imaging: No results found.   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[facility]) 10/28/2012   PAD (peripheral artery disease) ([facility]) 10/28/2012   Routine general medical examination at a health care facility 11/23/2011   Past Medical History:  Diagnosis Date   Complication of anesthesia    woke up during surgery   COPD (chronic obstructive pulmonary disease) ([place])    Olecranon bursitis of right elbow    Tobacco abuse    smoke for 40 years    Family History  Problem Relation Age of Onset   Diabetes Father    Alcohol abuse Neg Hx    Cancer Neg Hx    Early death Neg Hx    Heart disease Neg Hx    Hyperlipidemia Neg Hx    Hypertension Neg Hx    Stroke Neg Hx     Past Surgical History:  Procedure Laterality Date   INGUINAL HERNIA  REPAIR     left   OLECRANON BURSECTOMY Right 12/31/2020   Procedure: RIGHT OLECRANON  BURSITIS EXCISION;  Surgeon: Cammy Copa, MD;  Location: [facility];  Service: Orthopedics;  Laterality: Right;   ULNAR NERVE REPAIR     left   ulnar nerve surgery  per pt. 7-8 years ago   Left elbow and wrist   Social History   Occupational History   Occupation: Market researcher: [employer]  Tobacco Use   Smoking status: Some Days    Packs/day: 1.50    Years: 40.00    Total pack years: 60.00    Types: Cigarettes   Smokeless tobacco: Never  Vaping Use   Vaping Use: Never used  Substance and Sexual Activity   Alcohol use: No   Drug use: No   Sexual activity: Yes

## 2022-05-01 LAB — COLOGUARD: COLOGUARD: NEGATIVE

## 2022-05-02 ENCOUNTER — Other Ambulatory Visit: Payer: Self-pay | Admitting: Internal Medicine

## 2022-05-04 ENCOUNTER — Other Ambulatory Visit: Payer: Self-pay

## 2022-05-04 ENCOUNTER — Inpatient Hospital Stay: Payer: Commercial Managed Care - HMO | Attending: Hematology and Oncology | Admitting: Hematology and Oncology

## 2022-05-04 ENCOUNTER — Inpatient Hospital Stay: Payer: Commercial Managed Care - HMO

## 2022-05-04 VITALS — BP 145/89 | HR 87 | Temp 97.5°F | Resp 18 | Wt 230.3 lb

## 2022-05-04 DIAGNOSIS — F17210 Nicotine dependence, cigarettes, uncomplicated: Secondary | ICD-10-CM | POA: Diagnosis not present

## 2022-05-04 DIAGNOSIS — J449 Chronic obstructive pulmonary disease, unspecified: Secondary | ICD-10-CM | POA: Insufficient documentation

## 2022-05-04 DIAGNOSIS — D751 Secondary polycythemia: Secondary | ICD-10-CM | POA: Insufficient documentation

## 2022-05-04 LAB — CBC WITH DIFFERENTIAL (CANCER CENTER ONLY)
Abs Immature Granulocytes: 0.02 10*3/uL (ref 0.00–0.07)
Basophils Absolute: 0 10*3/uL (ref 0.0–0.1)
Basophils Relative: 1 %
Eosinophils Absolute: 0.3 10*3/uL (ref 0.0–0.5)
Eosinophils Relative: 4 %
HCT: 56.5 % — ABNORMAL HIGH (ref 39.0–52.0)
Hemoglobin: 19.1 g/dL — ABNORMAL HIGH (ref 13.0–17.0)
Immature Granulocytes: 0 %
Lymphocytes Relative: 33 %
Lymphs Abs: 2.1 10*3/uL (ref 0.7–4.0)
MCH: 30.4 pg (ref 26.0–34.0)
MCHC: 33.8 g/dL (ref 30.0–36.0)
MCV: 89.8 fL (ref 80.0–100.0)
Monocytes Absolute: 0.5 10*3/uL (ref 0.1–1.0)
Monocytes Relative: 8 %
Neutro Abs: 3.5 10*3/uL (ref 1.7–7.7)
Neutrophils Relative %: 54 %
Platelet Count: 166 10*3/uL (ref 150–400)
RBC: 6.29 MIL/uL — ABNORMAL HIGH (ref 4.22–5.81)
RDW: 13.9 % (ref 11.5–15.5)
WBC Count: 6.4 10*3/uL (ref 4.0–10.5)
nRBC: 0 % (ref 0.0–0.2)

## 2022-05-04 LAB — CMP (CANCER CENTER ONLY)
ALT: 18 U/L (ref 0–44)
AST: 14 U/L — ABNORMAL LOW (ref 15–41)
Albumin: 4.1 g/dL (ref 3.5–5.0)
Alkaline Phosphatase: 55 U/L (ref 38–126)
Anion gap: 3 — ABNORMAL LOW (ref 5–15)
BUN: 20 mg/dL (ref 6–20)
CO2: 36 mmol/L — ABNORMAL HIGH (ref 22–32)
Calcium: 9.5 mg/dL (ref 8.9–10.3)
Chloride: 100 mmol/L (ref 98–111)
Creatinine: 1.44 mg/dL — ABNORMAL HIGH (ref 0.61–1.24)
GFR, Estimated: 56 mL/min — ABNORMAL LOW (ref >60)
Glucose, Bld: 86 mg/dL (ref 70–99)
Potassium: 4.5 mmol/L (ref 3.5–5.1)
Sodium: 139 mmol/L (ref 135–145)
Total Bilirubin: 0.5 mg/dL (ref 0.3–1.2)
Total Protein: 6.9 g/dL (ref 6.5–8.1)

## 2022-05-04 LAB — RETIC PANEL
Immature Retic Fract: 6.6 % (ref 2.3–15.9)
RBC.: 6.22 MIL/uL — ABNORMAL HIGH (ref 4.22–5.81)
Retic Count, Absolute: 67.8 10*3/uL (ref 19.0–186.0)
Retic Ct Pct: 1.1 % (ref 0.4–3.1)
Reticulocyte Hemoglobin: 33.9 pg (ref >27.9)

## 2022-05-04 NOTE — Progress Notes (Signed)
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 Leonides Schanz   CHIEF COMPLAINTS/PURPOSE OF CONSULTATION:  "Polycythemia "  HISTORY OF PRESENTING ILLNESS:  Rodney Williams 62 y.o. male with medical history significant for COPD, tobacco use, and olecranon bursitis who presents for evaluation for polycythemia.   On review of the previous records Rodney Williams had normal blood counts on 11/23/2011 with a hemoglobin of 15.6.  The first record of polycythemia dates back to 12/30/2020 at a time he had a hemoglobin of 17.5 with a white blood cell count 6.7, MCV of 89.6, and platelets of 207.  Most recently on 04/13/2022 he had a hemoglobin of 18.6, MCV 89.3, white blood cells 5.8, and platelets of 189.  Due to concern for this polycythemia he was referred to hematology for further evaluation and management.  On exam today  Rodney Williams reports that he does not take any testosterone supplementation.  He notes that he sleeps alone but has been told before in the past he is trouble with snoring.  He notes he does not wake up with any shortness of breath and does not have any daytime sleepiness or headache.  He does take occasional naps throughout the day.  He is quite a heavy smoker but recently stopped about 10 days ago.  He is currently on the patch and reports that he is doing it because he " can't f### breathe".  He notes that he does not have any issues with itchiness.  He also is not having any trouble with nosebleeds, gum bleeding, though he does note occasional blood in his stool.  He notes that he underwent Cologuard  testing which was negative and underwent 1 colonoscopy once but "will never do that again".  On further discussion he notes that his family history is remarkable for mother with arrhythmia and a father has type 2 diabetes.  Both of his parents are still living.  He had a sister passed away of a myocardial infarction.  He has no children or spouse.  He reports that he has not had any alcohol to drink in 20 years and unfortunately is currently unemployed.  He was previously a Ship broker.  He otherwise denies any fevers, chills, sweats, nausea, vomiting or diarrhea.  A full 10 point ROS was otherwise negative.  MEDICAL HISTORY:  Past Medical History:  Diagnosis Date   Complication of anesthesia    woke up during surgery   COPD (chronic obstructive pulmonary disease) ([facility])    Olecranon bursitis of right elbow    Tobacco abuse    smoke for 40 years    SURGICAL HISTORY: Past Surgical History:  Procedure Laterality Date   INGUINAL HERNIA REPAIR     left   OLECRANON BURSECTOMY Right 12/31/2020   Procedure: RIGHT OLECRANON  BURSITIS EXCISION;  Surgeon: Cammy Copa, MD;  Location: [facility];  Service: Orthopedics;  Laterality: Right;   ULNAR NERVE REPAIR     left   ulnar nerve surgery  per pt. 7-8 years ago   Left elbow and wrist    SOCIAL HISTORY: Social History   Socioeconomic History   Marital  status: Single    Spouse name: Not on file   Number of children: 0   Years of education: Not on file   Highest education level: Not on file  Occupational History   Occupation: Market researcher: [employer]  Tobacco Use   Smoking status: Some Days    Packs/day: 1.50    Years: 40.00    Total pack years: 60.00    Types: Cigarettes   Smokeless tobacco: Never  Vaping Use   Vaping Use: Never used  Substance and Sexual Activity   Alcohol use: No   Drug use: No   Sexual activity: Yes  Other Topics Concern   Not on file  Social History Narrative   Not on file    Social Determinants of Health   Financial Resource Strain: Not on file  Food Insecurity: Not on file  Transportation Needs: Not on file  Physical Activity: Not on file  Stress: Not on file  Social Connections: Not on file  Intimate Partner Violence: Not on file    FAMILY HISTORY: Family History  Problem Relation Age of Onset   Diabetes Father    Alcohol abuse Neg Hx    Cancer Neg Hx    Early death Neg Hx    Heart disease Neg Hx    Hyperlipidemia Neg Hx    Hypertension Neg Hx    Stroke Neg Hx     ALLERGIES:  has No Known Allergies.  MEDICATIONS:  Current Outpatient Medications  Medication Sig Dispense Refill   albuterol (VENTOLIN HFA) 108 (90 Base) MCG/ACT inhaler TAKE 2 PUFFS BY MOUTH EVERY 6 HOURS AS NEEDED FOR WHEEZE OR SHORTNESS OF BREATH 8.5 each 2   aspirin EC 81 MG tablet Take 1 tablet (81 mg total) by mouth daily. 90 tablet 1   nicotine (NICODERM CQ) 14 mg/24hr patch Place 1 patch (14 mg total) onto the skin daily. (Patient not taking: Reported on 05/04/2022) 28 patch 0   nicotine (NICODERM CQ) 21 mg/24hr patch Place 1 patch (21 mg total) onto the skin daily. 28 patch 0   nicotine (NICODERM CQ) 7 mg/24hr patch Place 1 patch (7 mg total) onto the skin daily. (Patient not taking: Reported on 05/04/2022) 28 patch 0   rosuvastatin (CRESTOR) 20 MG tablet Take 1 tablet (20 mg total) by mouth daily. (Patient not taking: Reported on 05/04/2022) 90 tablet 1   umeclidinium-vilanterol (ANORO ELLIPTA) 62.5-25 MCG/ACT AEPB Inhale 1 puff into the lungs daily at 6 (six) AM. 120 each 1   No current facility-administered medications for this visit.    REVIEW OF SYSTEMS:   Constitutional: ( - ) fevers, ( - )  chills , ( - ) night sweats Eyes: ( - ) blurriness of vision, ( - ) double vision, ( - ) watery eyes Ears, nose, mouth, throat, and face: ( - ) mucositis, ( - ) sore throat Respiratory: ( - ) cough, ( - ) dyspnea, ( - ) wheezes Cardiovascular: ( - ) palpitation, ( - ) chest  discomfort, ( - ) lower extremity swelling Gastrointestinal:  ( - ) nausea, ( - ) heartburn, ( - ) change in bowel habits Skin: ( - ) abnormal skin rashes Lymphatics: ( - ) new lymphadenopathy, ( - ) easy bruising Neurological: ( - ) numbness, ( - ) tingling, ( - ) new weaknesses Behavioral/Psych: ( - ) mood change, ( - ) new changes  All other systems were reviewed with the patient and are negative.  PHYSICAL EXAMINATION:  Vitals:   05/04/22 1259  BP: (!) 145/89  Pulse: 87  Resp: 18  Temp: (!) 97.5 F (36.4 C)  SpO2: 94%   Filed Weights   05/04/22 1259  Weight: 230 lb 4.8 oz (104.5 kg)    GENERAL: well appearing middle-aged Caucasian male in NAD  SKIN: skin color, texture, turgor are normal, no rashes or significant lesions EYES: conjunctiva are pink and non-injected, sclera clear LUNGS: clear to auscultation and percussion with normal breathing effort HEART: regular rate & rhythm and no murmurs and no lower extremity edema Musculoskeletal: no cyanosis of digits and no clubbing  PSYCH: alert & oriented x 3, fluent speech NEURO: no focal motor/sensory deficits  LABORATORY DATA:  I have reviewed the data as listed    Latest Ref Rng & Units 04/13/2022   11:30 AM 12/30/2020    4:00 PM 11/23/2011    3:17 PM  CBC  WBC 4.0 - 10.5 K/uL 5.8  6.7  6.6   Hemoglobin 13.0 - 17.0 g/dL 75.1 Repeated and verified X2.  17.5  15.6   Hematocrit 39.0 - 52.0 % 55.0  51.7  46.5   Platelets 150.0 - 400.0 K/uL 189.0  207  178.0        Latest Ref Rng & Units 04/13/2022   11:30 AM 10/13/2021   12:10 PM 12/30/2020    4:00 PM  CMP  Glucose 70 - 99 mg/dL 80   700   BUN 6 - 23 mg/dL 14   15   Creatinine 1.74 - 1.50 mg/dL 9.44   9.67   Sodium 591 - 145 mEq/L 138   136   Potassium 3.5 - 5.1 mEq/L 3.9   4.6   Chloride 96 - 112 mEq/L 99   99   CO2 19 - 32 mEq/L 33   27   Calcium 8.4 - 10.5 mg/dL 9.5   9.1   Total Protein 6.0 - 8.3 g/dL  6.8    Total Bilirubin 0.2 - 1.2 mg/dL  0.4     Alkaline Phos 39 - 117 U/L  65    AST 0 - 37 U/L  14    ALT 0 - 53 U/L  16       ASSESSMENT & PLAN Alveda Reasons 61 y.o. male with medical history significant for COPD, tobacco use, and olecranon bursitis who presents for evaluation for polycythemia.   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   There are two types of polycythemia, Primary polycythemia and secondary polycythemia. Primary polycythemia is overproduction of red blood cells due to a driver mutation. The most common mutation is the JAK2 V617F (95% of cases), but there are other mutations which can cause this disorder. Primary polycythemia is a myeloproliferative neoplasm which may require cytoreductive therapy to decrease risk of thrombosis. This can consist of medications or phlebotomy to drive down the red blood cell counts. Secondary polycythemia is polycythemia driven by low oxygen levels. This represents an appropriate response of the body attempting to increase red cell volume. Causes of secondary polycythemia include smoking (most common), obstructive sleep apnea (OSA), or living at altitude. This can also be caused by testosterone supplementation. Certain thalassemias can present with marked erythrocytosis , but normal hemoglobin. Secondary polycythemia does not have the same level of thrombotic risk and therefore does not require cytoreductive therapy or phlebotomy.    #Polycythemia, likely 2/2 to  Smoking. --workup to include CBC, CMP, reticulocyte count, and erythropoietin level  --patient is an active smoker. Encourage smoking cessation.     --patient does not use any testosterone containing products  --no signs of symptoms concerning for OSA  --will order MPN workup to include JAK2 with reflex and BCR/ABL FISH  --RTC in 6 months or sooner if indicated by the above labs.    Orders Placed This Encounter   Procedures   CBC with Differential ([facility] Only)    Standing Status:   Future    Standing Expiration Date:   05/05/2023   CMP ([place] only)    Standing Status:   Future    Standing Expiration Date:   05/05/2023   Erythropoietin    Standing Status:   Future    Standing Expiration Date:   05/04/2023   JAK2 (INCLUDING V617F AND EXON 12), MPL,& CALR W/RFL MPN PANEL ([facility])    Standing Status:   Future    Standing Expiration Date:   05/04/2023   BCR ABL1 FISH ([facility])    Standing Status:   Future    Standing Expiration Date:   05/05/2023   Retic Panel    Standing Status:   Future    Standing Expiration Date:   05/05/2023    All questions were answered. The patient knows to call the clinic with any problems, questions or concerns.  A total of more than 60 minutes were spent on this encounter with face-to-face time and non-face-to-face time, including preparing to see the patient, ordering tests and/or medications, counseling the patient and coordination of care as outlined above.   Ledell Peoples, MD Department of Hematology/Oncology [facility] Phone: [phone] Pager: [phone] Email: [email or web address]  05/04/2022 1:45 PM

## 2022-05-05 LAB — ERYTHROPOIETIN: Erythropoietin: 11.8 m[IU]/mL (ref 2.6–18.5)

## 2022-05-10 LAB — JAK2 (INCLUDING V617F AND EXON 12), MPL,& CALR W/RFL MPN PANEL (NGS)

## 2022-05-10 LAB — BCR ABL1 FISH (GENPATH)

## 2022-05-18 ENCOUNTER — Telehealth: Payer: Self-pay | Admitting: *Deleted

## 2022-05-18 NOTE — Telephone Encounter (Signed)
TCT patient regarding recent lab result. Spoke with [place].  Advised that his labs results are most consistent with polycythemia secondary to smoking. Advised to continue with no smoking. Pt states he has had no cigarettes in 3 weeks. Encouraged pt to continue. Advised that we will see him back in 6 months Pt voiced understanding to the above.  Advised to call back with any questions or concerns prior to that appt.  Scheduling message sent

## 2022-05-18 NOTE — Telephone Encounter (Signed)
-----   Message from Jaci Standard, MD sent at 05/18/2022 10:33 AM EST ----- Please let Mr. Persons know that his blood work is returned and findings are most consistent with polycythemia secondary to smoking.  He does have a minor mutation, however this is not likely what is driving his red blood cell count high.  Encourage smoking cessation.  Please schedule an appointment to see him back in 6 months time. ----- Message ----- From: Leory Plowman, Lab In [place] Sent: 05/04/2022   2:04 PM EST To: Jaci Standard, MD

## 2022-05-31 ENCOUNTER — Other Ambulatory Visit: Payer: Self-pay | Admitting: Internal Medicine

## 2022-07-21 ENCOUNTER — Other Ambulatory Visit: Payer: Self-pay | Admitting: Internal Medicine

## 2022-07-21 DIAGNOSIS — J4489 Other specified chronic obstructive pulmonary disease: Secondary | ICD-10-CM

## 2022-07-21 DIAGNOSIS — Z720 Tobacco use: Secondary | ICD-10-CM

## 2022-08-31 ENCOUNTER — Other Ambulatory Visit: Payer: Self-pay | Admitting: Internal Medicine

## 2022-08-31 DIAGNOSIS — M19012 Primary osteoarthritis, left shoulder: Secondary | ICD-10-CM

## 2022-10-02 ENCOUNTER — Other Ambulatory Visit: Payer: Self-pay | Admitting: Internal Medicine

## 2022-10-02 DIAGNOSIS — J4489 Other specified chronic obstructive pulmonary disease: Secondary | ICD-10-CM

## 2022-11-03 ENCOUNTER — Other Ambulatory Visit: Payer: Self-pay | Admitting: Internal Medicine

## 2022-11-03 DIAGNOSIS — J4489 Other specified chronic obstructive pulmonary disease: Secondary | ICD-10-CM

## 2022-11-17 ENCOUNTER — Other Ambulatory Visit: Payer: Self-pay | Admitting: Hematology and Oncology

## 2022-11-17 ENCOUNTER — Other Ambulatory Visit: Payer: Self-pay

## 2022-11-17 ENCOUNTER — Inpatient Hospital Stay: Payer: 59 | Attending: Hematology and Oncology

## 2022-11-17 ENCOUNTER — Inpatient Hospital Stay: Payer: 59 | Admitting: Hematology and Oncology

## 2022-11-17 VITALS — BP 142/106 | HR 108 | Temp 98.1°F | Resp 18 | Wt 234.7 lb

## 2022-11-17 DIAGNOSIS — I700 Atherosclerosis of aorta: Secondary | ICD-10-CM | POA: Diagnosis not present

## 2022-11-17 DIAGNOSIS — Z7982 Long term (current) use of aspirin: Secondary | ICD-10-CM | POA: Insufficient documentation

## 2022-11-17 DIAGNOSIS — J439 Emphysema, unspecified: Secondary | ICD-10-CM | POA: Diagnosis not present

## 2022-11-17 DIAGNOSIS — D751 Secondary polycythemia: Secondary | ICD-10-CM

## 2022-11-17 DIAGNOSIS — Z79899 Other long term (current) drug therapy: Secondary | ICD-10-CM | POA: Insufficient documentation

## 2022-11-17 DIAGNOSIS — Z87891 Personal history of nicotine dependence: Secondary | ICD-10-CM | POA: Insufficient documentation

## 2022-11-17 DIAGNOSIS — R635 Abnormal weight gain: Secondary | ICD-10-CM | POA: Diagnosis not present

## 2022-11-17 LAB — CBC WITH DIFFERENTIAL (CANCER CENTER ONLY)
Abs Immature Granulocytes: 0.02 10*3/uL (ref 0.00–0.07)
Basophils Absolute: 0 10*3/uL (ref 0.0–0.1)
Basophils Relative: 1 %
Eosinophils Absolute: 0.1 10*3/uL (ref 0.0–0.5)
Eosinophils Relative: 2 %
HCT: 50.8 % (ref 39.0–52.0)
Hemoglobin: 17.7 g/dL — ABNORMAL HIGH (ref 13.0–17.0)
Immature Granulocytes: 0 %
Lymphocytes Relative: 26 %
Lymphs Abs: 1.6 10*3/uL (ref 0.7–4.0)
MCH: 30.7 pg (ref 26.0–34.0)
MCHC: 34.8 g/dL (ref 30.0–36.0)
MCV: 88.2 fL (ref 80.0–100.0)
Monocytes Absolute: 0.4 10*3/uL (ref 0.1–1.0)
Monocytes Relative: 7 %
Neutro Abs: 4.2 10*3/uL (ref 1.7–7.7)
Neutrophils Relative %: 64 %
Platelet Count: 174 10*3/uL (ref 150–400)
RBC: 5.76 MIL/uL (ref 4.22–5.81)
RDW: 13.3 % (ref 11.5–15.5)
WBC Count: 6.4 10*3/uL (ref 4.0–10.5)
nRBC: 0 % (ref 0.0–0.2)

## 2022-11-17 LAB — CMP (CANCER CENTER ONLY)
ALT: 27 U/L (ref 0–44)
AST: 19 U/L (ref 15–41)
Albumin: 4.4 g/dL (ref 3.5–5.0)
Alkaline Phosphatase: 58 U/L (ref 38–126)
Anion gap: 5 (ref 5–15)
BUN: 16 mg/dL (ref 8–23)
CO2: 34 mmol/L — ABNORMAL HIGH (ref 22–32)
Calcium: 9.2 mg/dL (ref 8.9–10.3)
Chloride: 99 mmol/L (ref 98–111)
Creatinine: 1.31 mg/dL — ABNORMAL HIGH (ref 0.61–1.24)
GFR, Estimated: >60 mL/min (ref >60)
Glucose, Bld: 127 mg/dL — ABNORMAL HIGH (ref 70–99)
Potassium: 4.2 mmol/L (ref 3.5–5.1)
Sodium: 138 mmol/L (ref 135–145)
Total Bilirubin: 0.4 mg/dL (ref 0.3–1.2)
Total Protein: 6.9 g/dL (ref 6.5–8.1)

## 2022-11-17 NOTE — Progress Notes (Signed)
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 Leonides Schanz   Interval History:  Rodney Williams 62 y.o. 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 The patient's last visit was on 05/04/2022. In the interim since the last visit he has had no major changes in his health.  On exam today Mr. Andreola reports that he was able to quit smoking approximately 8 months ago.  He has been using the patch and has fortunately been off of cigarettes.  Unfortunately he is also been gaining weight with his weight going from 227 pounds up to 234 pounds.  He reports that his energy is "not all that great" he currently reports is about a 6 out of 10.  He does have some easy shortness of breath and does get winded pretty easily.  He does have a cough in the morning which is dry and does not produce any phlegm.  He otherwise denies any fevers, chills, sweats, nausea, vomiting or diarrhea.  A full 10 point ROS is otherwise negative.  MEDICAL HISTORY:  Past Medical History:  Diagnosis Date   Complication of anesthesia    woke up during surgery   COPD (chronic obstructive pulmonary disease) (HCC)    Olecranon bursitis of right elbow    Tobacco abuse    smoke for 40 years    SURGICAL HISTORY: Past Surgical History:  Procedure Laterality Date   INGUINAL HERNIA REPAIR     left   OLECRANON BURSECTOMY Right 12/31/2020   Procedure: RIGHT OLECRANON  BURSITIS EXCISION;  Surgeon: Cammy Copa, MD;  Location: [facility];   Service: Orthopedics;  Laterality: Right;   ULNAR NERVE REPAIR     left   ulnar nerve surgery  per pt. 7-8 years ago   Left elbow and wrist    SOCIAL HISTORY: Social History   Socioeconomic History   Marital status: Single    Spouse name: Not on file   Number of children: 0   Years of education: Not on file   Highest education level: Not on file  Occupational History   Occupation: Market researcher: [employer]  Tobacco Use   Smoking status: Some Days    Packs/day: 1.50    Years: 40.00    Additional pack years: 0.00    Total pack years: 60.00    Types: Cigarettes   Smokeless tobacco: Never  Vaping Use   Vaping Use: Never used  Substance and Sexual Activity   Alcohol use: No   Drug use: No   Sexual activity: Yes  Other Topics Concern   Not on file  Social History Narrative   Not on file   Social Determinants of Health   Financial Resource Strain: Not on file  Food Insecurity: Not on file  Transportation Needs: Not on file  Physical Activity: Not on file  Stress: Not on file  Social Connections: Not on file  Intimate Partner Violence: Not on file    FAMILY HISTORY: Family History  Problem Relation Age of Onset   Diabetes Father  Alcohol abuse Neg Hx    Cancer Neg Hx    Early death Neg Hx    Heart disease Neg Hx    Hyperlipidemia Neg Hx    Hypertension Neg Hx    Stroke Neg Hx     ALLERGIES:  has No Known Allergies.  MEDICATIONS:  Current Outpatient Medications  Medication Sig Dispense Refill   albuterol (VENTOLIN HFA) 108 (90 Base) MCG/ACT inhaler TAKE 2 PUFFS BY MOUTH EVERY 6 HOURS AS NEEDED FOR WHEEZE OR SHORTNESS OF BREATH 8.5 each 2   ANORO ELLIPTA 62.5-25 MCG/ACT AEPB INHALE 1 PUFF INTO THE LUNGS DAILY AT 6 (SIX) AM. 60 each 0   aspirin EC 81 MG tablet Take 1 tablet (81 mg total) by mouth daily. 90 tablet 1   nicotine (NICODERM CQ - DOSED IN MG/24 HOURS) 21 mg/24hr patch PLACE 1 PATCH ONTO THE SKIN DAILY. 28 patch 0   nicotine (NICODERM  CQ) 14 mg/24hr patch Place 1 patch (14 mg total) onto the skin daily. (Patient not taking: Reported on 05/04/2022) 28 patch 0   nicotine (NICODERM CQ) 7 mg/24hr patch Place 1 patch (7 mg total) onto the skin daily. (Patient not taking: Reported on 05/04/2022) 28 patch 0   rosuvastatin (CRESTOR) 20 MG tablet Take 1 tablet (20 mg total) by mouth daily. (Patient not taking: Reported on 05/04/2022) 90 tablet 1   No current facility-administered medications for this visit.    REVIEW OF SYSTEMS:   Constitutional: ( - ) fevers, ( - )  chills , ( - ) night sweats Eyes: ( - ) blurriness of vision, ( - ) double vision, ( - ) watery eyes Ears, nose, mouth, throat, and face: ( - ) mucositis, ( - ) sore throat Respiratory: ( - ) cough, ( - ) dyspnea, ( - ) wheezes Cardiovascular: ( - ) palpitation, ( - ) chest discomfort, ( - ) lower extremity swelling Gastrointestinal:  ( - ) nausea, ( - ) heartburn, ( - ) change in bowel habits Skin: ( - ) abnormal skin rashes Lymphatics: ( - ) new lymphadenopathy, ( - ) easy bruising Neurological: ( - ) numbness, ( - ) tingling, ( - ) new weaknesses Behavioral/Psych: ( - ) mood change, ( - ) new changes  All other systems were reviewed with the patient and are negative.  PHYSICAL EXAMINATION:   Vitals:   11/17/22 1438  BP: (!) 142/106  Pulse: (!) 108  Resp: 18  Temp: 98.1 F (36.7 C)  SpO2: 95%   Filed Weights   11/17/22 1438  Weight: 234 lb 11.2 oz (106.5 kg)    GENERAL: Well-appearing middle-aged Caucasian male, alert, no distress and comfortable SKIN: skin color, texture, turgor are normal, no rashes or significant lesions EYES: conjunctiva are pink and non-injected, sclera clear LUNGS: clear to auscultation and percussion with normal breathing effort HEART: regular rate & rhythm and no murmurs and no lower extremity edema Musculoskeletal: no cyanosis of digits and no clubbing  PSYCH: alert & oriented x 3, fluent speech NEURO: no focal motor/sensory  deficits  LABORATORY DATA:  I have reviewed the data as listed    Latest Ref Rng & Units 11/17/2022    2:20 PM 05/04/2022    1:47 PM 04/13/2022   11:30 AM  CBC  WBC 4.0 - 10.5 K/uL 6.4  6.4  5.8   Hemoglobin 13.0 - 17.0 g/dL 16.1  09.6  04.5 Repeated and verified X2.   Hematocrit 39.0 - 52.0 % 50.8  56.5  55.0   Platelets 150 - 400 K/uL 174  166  189.0        Latest Ref Rng & Units 11/17/2022    2:20 PM 05/04/2022    1:47 PM 04/13/2022   11:30 AM  CMP  Glucose 70 - 99 mg/dL 161  86  80   BUN 8 - 23 mg/dL 16  20  14    Creatinine 0.61 - 1.24 mg/dL 0.96  0.45  4.09   Sodium 135 - 145 mmol/L 138  139  138   Potassium 3.5 - 5.1 mmol/L 4.2  4.5  3.9   Chloride 98 - 111 mmol/L 99  100  99   CO2 22 - 32 mmol/L 34  36  33   Calcium 8.9 - 10.3 mg/dL 9.2  9.5  9.5   Total Protein 6.5 - 8.1 g/dL 6.9  6.9    Total Bilirubin 0.3 - 1.2 mg/dL 0.4  0.5    Alkaline Phos 38 - 126 U/L 58  55    AST 15 - 41 U/L 19  14    ALT 0 - 44 U/L 27  18      RADIOGRAPHIC STUDIES: CT CHEST LUNG CA SCREEN LOW DOSE W/O CM  Result Date: 11/23/2022 CLINICAL DATA:  Current smoker.  70 pack-year history. EXAM: CT CHEST WITHOUT CONTRAST LOW-DOSE FOR LUNG CANCER SCREENING TECHNIQUE: Multidetector CT imaging of the chest was performed following the standard protocol without IV contrast. RADIATION DOSE REDUCTION: This exam was performed according to the departmental dose-optimization program which includes automated exposure control, adjustment of the mA and/or kV according to patient size and/or use of iterative reconstruction technique. COMPARISON:  11/18/2021 FINDINGS: Cardiovascular: Aortic atherosclerosis. Tortuous thoracic aorta. Normal heart size, without pericardial effusion. Mediastinum/Nodes: No mediastinal or hilar adenopathy, given limitations of unenhanced CT. Lungs/Pleura: No pleural fluid. Mild centrilobular emphysema. Pulmonary nodules of maximally volume derived equivalent diameter 5.3 mm, similar. Upper  Abdomen: Mild hepatic steatosis. Normal imaged portions of the spleen, stomach, pancreas, adrenal glands, kidneys. Musculoskeletal: Presumed sebaceous cyst about the posterior right chest wall 2.0 cm, similar. No acute osseous abnormality. IMPRESSION: 1. Lung-RADS 2, benign appearance or behavior. Continue annual screening with low-dose chest CT without contrast in 12 months. 2. Hepatic steatosis. 3. Aortic Atherosclerosis (ICD10-I70.0) and Emphysema (ICD10-J43.9). Electronically Signed   By: Jeronimo Greaves M.D.   On: 11/23/2022 11:29   ASSESSMENT & PLAN DONOVEN RYBA 62 y.o. 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    There are two types of polycythemia, Primary polycythemia and secondary polycythemia. Primary polycythemia is overproduction of red blood cells due to a driver mutation. The most common mutation is the JAK2 [id number] (95% of cases), but there are other mutations which can cause this disorder. Primary polycythemia is a myeloproliferative neoplasm which may require cytoreductive therapy to decrease risk of thrombosis. This can consist of medications or phlebotomy to drive down the red blood cell counts. Secondary polycythemia is polycythemia driven by low oxygen levels. This represents an appropriate response of the body attempting to increase red cell volume. Causes of secondary polycythemia include smoking (most common), obstructive sleep apnea (OSA), or living at altitude. This can also be caused by testosterone supplementation. Certain thalassemias can present with marked erythrocytosis ,  but normal hemoglobin. Secondary polycythemia does not have the same level of thrombotic risk and therefore does not require cytoreductive therapy or phlebotomy.      #Polycythemia, likely 2/2 to Smoking. --patient quit smoking 8 months ago.. 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  Hemoglobin steadily improving --RTC in 12 months or sooner if indicated by the above labs.    No orders of the defined types were placed in this encounter.   All questions were answered. The patient knows to call the clinic with any problems, questions or concerns.  A total of more than 30 minutes were spent on this encounter with face-to-face time and non-face-to-face time, including preparing to see the patient, ordering tests and/or medications, counseling the patient and coordination of care as outlined above.   Ulysees Barns, MD Department of Hematology/Oncology [facility] at [facility] Phone: [phone] Pager: [phone] Email: [email or web address]  11/26/2022 6:08 PM

## 2022-11-20 ENCOUNTER — Ambulatory Visit
Admission: RE | Admit: 2022-11-20 | Discharge: 2022-11-20 | Disposition: A | Discharge status: Home / Self Care | Payer: 59 | Source: Ambulatory Visit | Attending: Family Medicine | Admitting: Family Medicine

## 2022-11-20 DIAGNOSIS — Z122 Encounter for screening for malignant neoplasm of respiratory organs: Secondary | ICD-10-CM

## 2022-11-20 DIAGNOSIS — F17210 Nicotine dependence, cigarettes, uncomplicated: Secondary | ICD-10-CM

## 2022-11-20 DIAGNOSIS — Z87891 Personal history of nicotine dependence: Secondary | ICD-10-CM

## 2022-11-21 ENCOUNTER — Telehealth: Payer: Self-pay | Admitting: Hematology and Oncology

## 2022-11-23 ENCOUNTER — Other Ambulatory Visit: Payer: Self-pay | Admitting: Acute Care

## 2022-11-23 DIAGNOSIS — F17210 Nicotine dependence, cigarettes, uncomplicated: Secondary | ICD-10-CM

## 2022-11-23 DIAGNOSIS — Z87891 Personal history of nicotine dependence: Secondary | ICD-10-CM

## 2022-11-23 DIAGNOSIS — Z122 Encounter for screening for malignant neoplasm of respiratory organs: Secondary | ICD-10-CM

## 2022-11-30 ENCOUNTER — Other Ambulatory Visit: Payer: Self-pay | Admitting: Internal Medicine

## 2023-01-31 ENCOUNTER — Telehealth: Payer: Self-pay | Admitting: Internal Medicine

## 2023-01-31 MED ORDER — ALBUTEROL SULFATE HFA 108 (90 BASE) MCG/ACT IN AERS: 2.0000 | INHALATION_SPRAY | Freq: Four times a day (QID) | RESPIRATORY_TRACT | 0 refills | Status: DC | PRN

## 2023-01-31 NOTE — Telephone Encounter (Signed)
Sent 30 day supply to pharmacy until appt.../lm,b

## 2023-01-31 NOTE — Telephone Encounter (Signed)
Prescription Request  01/31/2023  LOV: 04/13/2022  What is the name of the medication or equipment? albuterol  Have you contacted your pharmacy to request a refill? Yes   Which pharmacy would you like this sent to?  [id number] [place], [place] - [address] [address] Phone: [phone] Fax: [phone]     Patient notified that their request is being sent to the clinical staff for review and that they should receive a response within 2 business days.   Please advise at Mobile [phone] (mobile)

## 2023-02-22 ENCOUNTER — Encounter: Payer: Self-pay | Admitting: Internal Medicine

## 2023-02-22 ENCOUNTER — Ambulatory Visit: Payer: 59 | Admitting: Internal Medicine

## 2023-02-22 VITALS — BP 146/88 | HR 66 | Temp 98.1°F | Resp 16 | Ht 76.0 in | Wt 239.6 lb

## 2023-02-22 DIAGNOSIS — R072 Precordial pain: Secondary | ICD-10-CM | POA: Insufficient documentation

## 2023-02-22 DIAGNOSIS — R739 Hyperglycemia, unspecified: Secondary | ICD-10-CM | POA: Diagnosis not present

## 2023-02-22 DIAGNOSIS — E785 Hyperlipidemia, unspecified: Secondary | ICD-10-CM

## 2023-02-22 DIAGNOSIS — I723 Aneurysm of iliac artery: Secondary | ICD-10-CM | POA: Diagnosis not present

## 2023-02-22 DIAGNOSIS — I10 Essential (primary) hypertension: Secondary | ICD-10-CM | POA: Diagnosis not present

## 2023-02-22 DIAGNOSIS — J4489 Other specified chronic obstructive pulmonary disease: Secondary | ICD-10-CM | POA: Diagnosis not present

## 2023-02-22 DIAGNOSIS — Z0000 Encounter for general adult medical examination without abnormal findings: Secondary | ICD-10-CM

## 2023-02-22 DIAGNOSIS — R3129 Other microscopic hematuria: Secondary | ICD-10-CM

## 2023-02-22 DIAGNOSIS — D751 Secondary polycythemia: Secondary | ICD-10-CM

## 2023-02-22 DIAGNOSIS — Z23 Encounter for immunization: Secondary | ICD-10-CM

## 2023-02-22 DIAGNOSIS — J431 Panlobular emphysema: Secondary | ICD-10-CM | POA: Insufficient documentation

## 2023-02-22 DIAGNOSIS — I739 Peripheral vascular disease, unspecified: Secondary | ICD-10-CM | POA: Diagnosis not present

## 2023-02-22 DIAGNOSIS — R9431 Abnormal electrocardiogram [ECG] [EKG]: Secondary | ICD-10-CM | POA: Insufficient documentation

## 2023-02-22 LAB — CBC WITH DIFFERENTIAL/PLATELET
Basophils Absolute: 0 10*3/uL (ref 0.0–0.1)
Basophils Relative: 0.6 % (ref 0.0–3.0)
Eosinophils Absolute: 0.2 10*3/uL (ref 0.0–0.7)
Eosinophils Relative: 4 % (ref 0.0–5.0)
HCT: 49.9 % (ref 39.0–52.0)
Hemoglobin: 16.6 g/dL (ref 13.0–17.0)
Lymphocytes Relative: 35.8 % (ref 12.0–46.0)
Lymphs Abs: 2 10*3/uL (ref 0.7–4.0)
MCHC: 33.3 g/dL (ref 30.0–36.0)
MCV: 90.7 fl (ref 78.0–100.0)
Monocytes Absolute: 0.5 10*3/uL (ref 0.1–1.0)
Monocytes Relative: 8.6 % (ref 3.0–12.0)
Neutro Abs: 2.8 10*3/uL (ref 1.4–7.7)
Neutrophils Relative %: 51 % (ref 43.0–77.0)
Platelets: 192 10*3/uL (ref 150.0–400.0)
RBC: 5.5 Mil/uL (ref 4.22–5.81)
RDW: 14.3 % (ref 11.5–15.5)
WBC: 5.5 10*3/uL (ref 4.0–10.5)

## 2023-02-22 LAB — URINALYSIS, ROUTINE W REFLEX MICROSCOPIC
Bilirubin Urine: NEGATIVE
Ketones, ur: NEGATIVE
Leukocytes,Ua: NEGATIVE
Nitrite: NEGATIVE
Specific Gravity, Urine: 1.025 (ref 1.000–1.030)
Total Protein, Urine: NEGATIVE
Urine Glucose: NEGATIVE
Urobilinogen, UA: 0.2 (ref 0.0–1.0)
pH: 6 (ref 5.0–8.0)

## 2023-02-22 LAB — BASIC METABOLIC PANEL
BUN: 17 mg/dL (ref 6–23)
CO2: 33 meq/L — ABNORMAL HIGH (ref 19–32)
Calcium: 9.4 mg/dL (ref 8.4–10.5)
Chloride: 99 meq/L (ref 96–112)
Creatinine, Ser: 1.35 mg/dL (ref 0.40–1.50)
GFR: 56.67 mL/min — ABNORMAL LOW (ref >60.00)
Glucose, Bld: 92 mg/dL (ref 70–99)
Potassium: 4.3 meq/L (ref 3.5–5.1)
Sodium: 137 meq/L (ref 135–145)

## 2023-02-22 LAB — LIPID PANEL
Cholesterol: 186 mg/dL (ref 0–200)
HDL: 34.7 mg/dL — ABNORMAL LOW (ref >39.00)
NonHDL: 151.6
Total CHOL/HDL Ratio: 5
Triglycerides: 395 mg/dL — ABNORMAL HIGH (ref 0.0–149.0)
VLDL: 79 mg/dL — ABNORMAL HIGH (ref 0.0–40.0)

## 2023-02-22 LAB — HEMOGLOBIN A1C: Hgb A1c MFr Bld: 5.8 % (ref 4.6–6.5)

## 2023-02-22 LAB — LDL CHOLESTEROL, DIRECT: Direct LDL: 128 mg/dL

## 2023-02-22 LAB — TROPONIN I: Troponin I: 8 ng/L (ref <=47)

## 2023-02-22 MED ORDER — BOOSTRIX 5-2.5-18.5 LF-MCG/0.5 IM SUSP
0.5000 mL | Freq: Once | INTRAMUSCULAR | 0 refills | Status: AC
Start: 2023-02-22 — End: 2023-02-22

## 2023-02-22 MED ORDER — ASPIRIN 81 MG PO TBEC: 81.0000 mg | DELAYED_RELEASE_TABLET | Freq: Every day | ORAL | 1 refills | Status: DC

## 2023-02-22 MED ORDER — SHINGRIX 50 MCG/0.5ML IM SUSR
0.5000 mL | Freq: Once | INTRAMUSCULAR | 1 refills | Status: AC
Start: 2023-02-22 — End: 2023-02-22

## 2023-02-22 NOTE — Progress Notes (Signed)
Subjective:  Patient ID: Rodney Williams, male    DOB: 1961-04-30  Age: 62 y.o. MRN: [id number]  CC: Annual Exam, Hypertension, Hyperlipidemia, and COPD   HPI [name] presents for a CPX and f/up -----  Discussed the use of AI scribe software for clinical note transcription with the patient, who gave verbal consent to proceed.  History of Present Illness   The patient, with a history of smoking cessation ten months prior, presents with occasional dyspnea and a recent episode of chest discomfort. They describe the discomfort as a sudden, cramp-like sensation located over the left ribcage, likening it to the feeling of an 'elephant stepping on my chest.' The episode was brief and resolved spontaneously without radiation of pain or associated symptoms. They deny any similar previous episodes, attributing past chest discomfort to rib cramping.  They deny hemoptysis, dizziness, lightheadedness, dysphagia, and symptoms of gastroesophageal reflux. They do, however, report occasional fatigue.  patient has a known history of hypertension, with a recent blood pressure reading of 148/90. They also report chronic left shoulder pain, previously attributed to arthritis by another healthcare provider.       Outpatient Medications Prior to Visit  Medication Sig Dispense Refill   albuterol (VENTOLIN HFA) 108 (90 Base) MCG/ACT inhaler Inhale 2 puffs into the lungs every 6 (six) hours as needed for wheezing or shortness of breath. Must keep Aug appt for future refills 8.5 each 0   ANORO ELLIPTA 62.5-25 MCG/ACT AEPB INHALE 1 PUFF INTO THE LUNGS DAILY AT 6 (SIX) AM. 60 each 0   aspirin EC 81 MG tablet Take 1 tablet (81 mg total) by mouth daily. 90 tablet 1   nicotine (NICODERM CQ - DOSED IN MG/24 HOURS) 21 mg/24hr patch PLACE 1 PATCH ONTO THE SKIN DAILY. (Patient not taking: Reported on 02/22/2023) 28 patch 0   nicotine (NICODERM CQ) 14 mg/24hr patch Place 1 patch (14 mg total) onto the skin daily.  (Patient not taking: Reported on 05/04/2022) 28 patch 0   nicotine (NICODERM CQ) 7 mg/24hr patch Place 1 patch (7 mg total) onto the skin daily. (Patient not taking: Reported on 05/04/2022) 28 patch 0   rosuvastatin (CRESTOR) 20 MG tablet Take 1 tablet (20 mg total) by mouth daily. (Patient not taking: Reported on 05/04/2022) 90 tablet 1   No facility-administered medications prior to visit.    ROS Review of Systems  Constitutional:  Negative for appetite change, diaphoresis, fatigue and fever.  HENT: Negative.  Negative for trouble swallowing.   Eyes: Negative.   Respiratory:  Positive for shortness of breath. Negative for cough, chest tightness and wheezing.   Cardiovascular:  Positive for chest pain. Negative for palpitations and leg swelling.  Gastrointestinal:  Negative for abdominal pain, constipation, diarrhea, nausea and vomiting.  Genitourinary: Negative.  Negative for difficulty urinating and dysuria.  Musculoskeletal: Negative.  Negative for arthralgias, back pain and myalgias.  Skin: Negative.   Neurological: Negative.  Negative for dizziness, syncope, weakness and light-headedness.  Hematological:  Negative for adenopathy. Does not bruise/bleed easily.  Psychiatric/Behavioral:  Positive for confusion.     Objective:  BP (!) 146/88 (BP Location: Right Arm, Patient Position: Sitting, Cuff Size: Large)   Pulse 66   Temp 98.1 F (36.7 C) (Oral)   Resp 16   Ht 6\' 4"  (1.93 m)   Wt 239 lb 9.6 oz (108.7 kg)   SpO2 99%   BMI 29.17 kg/m   BP Readings from Last 3 Encounters:  02/22/23 (!) 146/88  11/17/22 (!) 142/106  05/04/22 (!) 145/89    Wt Readings from Last 3 Encounters:  02/22/23 239 lb 9.6 oz (108.7 kg)  11/17/22 234 lb 11.2 oz (106.5 kg)  05/04/22 230 lb 4.8 oz (104.5 kg)    Physical Exam Vitals reviewed.  Constitutional:      General: He is not in acute distress.    Appearance: He is ill-appearing. He is not toxic-appearing or diaphoretic.  HENT:     Nose:  Nose normal.     Mouth/Throat:     Mouth: Mucous membranes are moist.  Eyes:     General: No scleral icterus.    Conjunctiva/sclera: Conjunctivae normal.  Cardiovascular:     Rate and Rhythm: Normal rate and regular rhythm.     Pulses: Normal pulses.     Heart sounds: No murmur heard.    No friction rub. No gallop.     Comments: EKG- NSR, 66 bpm Diffusely flat/inverted T waves ?anterior infarct pattern  Pulmonary:     Effort: Pulmonary effort is normal.     Breath sounds: No stridor. No wheezing, rhonchi or rales.  Abdominal:     General: Abdomen is protuberant. Bowel sounds are normal. There is no distension.     Palpations: Abdomen is soft. There is no hepatomegaly, splenomegaly or mass.     Tenderness: There is no abdominal tenderness.     Hernia: No hernia is present.  Genitourinary:    Comments: GU/rectal exams deferred at this request Musculoskeletal:        General: Normal range of motion.     Cervical back: Neck supple.     Right lower leg: No edema.     Left lower leg: No edema.  Lymphadenopathy:     Cervical: No cervical adenopathy.  Skin:    General: Skin is warm and dry.     Coloration: Skin is not jaundiced.     Findings: No rash.  Neurological:     General: No focal deficit present.     Mental Status: He is alert. Mental status is at baseline.  Psychiatric:        Mood and Affect: Mood normal.        Behavior: Behavior normal.     Lab Results  Component Value Date   WBC 5.5 02/22/2023   HGB 16.6 02/22/2023   HCT 49.9 02/22/2023   PLT 192.0 02/22/2023   GLUCOSE 92 02/22/2023   CHOL 186 02/22/2023   TRIG 395.0 (H) 02/22/2023   HDL 34.70 (L) 02/22/2023   LDLDIRECT 128.0 02/22/2023   ALT 27 11/17/2022   AST 19 11/17/2022   NA 137 02/22/2023   K 4.3 02/22/2023   CL 99 02/22/2023   CREATININE 1.35 02/22/2023   BUN 17 02/22/2023   CO2 33 (H) 02/22/2023   TSH 5.27 02/22/2023   PSA 1.17 02/22/2023   HGBA1C 5.8 02/22/2023    CT CHEST LUNG CA  SCREEN LOW DOSE W/O CM  Result Date: 11/23/2022 CLINICAL DATA:  Current smoker.  70 pack-year history. EXAM: CT CHEST WITHOUT CONTRAST LOW-DOSE FOR LUNG CANCER SCREENING TECHNIQUE: Multidetector CT imaging of the chest was performed following the standard protocol without IV contrast. RADIATION DOSE REDUCTION: This exam was performed according to the departmental dose-optimization program which includes automated exposure control, adjustment of the mA and/or kV according to patient size and/or use of iterative reconstruction technique. COMPARISON:  11/18/2021 FINDINGS: Cardiovascular: Aortic atherosclerosis. Tortuous thoracic aorta. Normal heart size, without pericardial effusion. Mediastinum/Nodes: No mediastinal or  hilar adenopathy, given limitations of unenhanced CT. Lungs/Pleura: No pleural fluid. Mild centrilobular emphysema. Pulmonary nodules of maximally volume derived equivalent diameter 5.3 mm, similar. Upper Abdomen: Mild hepatic steatosis. Normal imaged portions of the spleen, stomach, pancreas, adrenal glands, kidneys. Musculoskeletal: Presumed sebaceous cyst about the posterior right chest wall 2.0 cm, similar. No acute osseous abnormality. IMPRESSION: 1. Lung-RADS 2, benign appearance or behavior. Continue annual screening with low-dose chest CT without contrast in 12 months. 2. Hepatic steatosis. 3. Aortic Atherosclerosis (ICD10-I70.0) and Emphysema (ICD10-J43.9). Electronically Signed   By: Jeronimo Greaves M.D.   On: 11/23/2022 11:29   Assessment & Plan:   Routine general medical examination at a health care facility - Exam completed, labs reviewed, vaccines reviewed and updated, cancer screenings addressed, pt ed material was given.  -     PSA; Future  Aneurysm of iliac artery (HCC)  PAD (peripheral artery disease) (HCC)- Risk factor modifications addressed. -     Lipid panel; Future -     Aspirin; Take 1 tablet (81 mg total) by mouth daily. Swallow whole.  Dispense: 90 tablet; Refill:  1 -     Rosuvastatin Calcium; Take 1 tablet (20 mg total) by mouth daily.  Dispense: 90 tablet; Refill: 1  Hematuria, microscopic- There are only 3-6 RBCs per high-powered field. -     Urinalysis, Routine w reflex microscopic; Future  Hyperlipidemia with target low density lipoprotein (LDL) cholesterol less than 70 mg/dL - Will restart the statin. -     Lipid panel; Future -     TSH; Future -     Rosuvastatin Calcium; Take 1 tablet (20 mg total) by mouth daily.  Dispense: 90 tablet; Refill: 1  COPD (chronic obstructive pulmonary disease) with chronic bronchitis -     EKG 12-Lead  Erythrocytosis -     CBC with Differential/Platelet; Future  Panlobular emphysema (HCC)  Abnormal EKG -     Brain natriuretic peptide; Future -     Troponin I (High Sensitivity); Future -     Troponin I -; Future -     MYOCARDIAL PERFUSION IMAGING; Future -     Cardiac Stress Test: Informed Consent Details: Physician/Practitioner Attestation; Transcribe to consent form and obtain patient signature; Future  Precordial chest pain- BNP and troponin are normal.  Will evaluate for ischemia with an MPI. -     Brain natriuretic peptide; Future -     Troponin I (High Sensitivity); Future -     Troponin I -; Future -     MYOCARDIAL PERFUSION IMAGING; Future -     Cardiac Stress Test: Informed Consent Details: Physician/Practitioner Attestation; Transcribe to consent form and obtain patient signature; Future  Chronic hyperglycemia -     Basic metabolic panel; Future -     Hemoglobin A1c; Future  Primary hypertension -     Urinalysis, Routine w reflex microscopic; Future -     Olmesartan Medoxomil; Take 1 tablet (20 mg total) by mouth daily.  Dispense: 90 tablet; Refill: 0  Need for prophylactic vaccination with combined diphtheria-tetanus-pertussis (DTP) vaccine -     Boostrix; Inject 0.5 mLs into the muscle once for 1 dose.  Dispense: 0.5 mL; Refill: 0  Need for varicella vaccine -     Shingrix; Inject  0.5 mLs into the muscle once for 1 dose.  Dispense: 0.5 mL; Refill: 1  Other orders -     LDL cholesterol, direct     Follow-up: Return in about 3 months (around 05/25/2023).  Maisie Fus  Yetta Barre, MD

## 2023-02-22 NOTE — Patient Instructions (Signed)
Health Maintenance, Male Adopting a healthy lifestyle and getting preventive care are important in promoting health and wellness. Ask your health care provider about: The right schedule for you to have regular tests and exams. Things you can do on your own to prevent diseases and keep yourself healthy. What should I know about diet, weight, and exercise? Eat a healthy diet  Eat a diet that includes plenty of vegetables, fruits, low-fat dairy products, and lean protein. Do not eat a lot of foods that are high in solid fats, added sugars, or sodium. Maintain a healthy weight Body mass index (BMI) is a measurement that can be used to identify possible weight problems. It estimates body fat based on height and weight. Your health care provider can help determine your BMI and help you achieve or maintain a healthy weight. Get regular exercise Get regular exercise. This is one of the most important things you can do for your health. Most adults should: Exercise for at least 150 minutes each week. The exercise should increase your heart rate and make you sweat (moderate-intensity exercise). Do strengthening exercises at least twice a week. This is in addition to the moderate-intensity exercise. Spend less time sitting. Even light physical activity can be beneficial. Watch cholesterol and blood lipids Have your blood tested for lipids and cholesterol at 61 years of age, then have this test every 5 years. You may need to have your cholesterol levels checked more often if: Your lipid or cholesterol levels are high. You are older than 61 years of age. You are at high risk for heart disease. What should I know about cancer screening? Many types of cancers can be detected early and may often be prevented. Depending on your health history and family history, you may need to have cancer screening at various ages. This may include screening for: Colorectal cancer. Prostate cancer. Skin cancer. Lung  cancer. What should I know about heart disease, diabetes, and high blood pressure? Blood pressure and heart disease High blood pressure causes heart disease and increases the risk of stroke. This is more likely to develop in people who have high blood pressure readings or are overweight. Talk with your health care provider about your target blood pressure readings. Have your blood pressure checked: Every 3-5 years if you are 18-39 years of age. Every year if you are 40 years old or older. If you are between the ages of 65 and 75 and are a current or former smoker, ask your health care provider if you should have a one-time screening for abdominal aortic aneurysm (AAA). Diabetes Have regular diabetes screenings. This checks your fasting blood sugar level. Have the screening done: Once every three years after age 45 if you are at a normal weight and have a low risk for diabetes. More often and at a younger age if you are overweight or have a high risk for diabetes. What should I know about preventing infection? Hepatitis B If you have a higher risk for hepatitis B, you should be screened for this virus. Talk with your health care provider to find out if you are at risk for hepatitis B infection. Hepatitis C Blood testing is recommended for: Everyone born from 1945 through 1965. Anyone with known risk factors for hepatitis C. Sexually transmitted infections (STIs) You should be screened each year for STIs, including gonorrhea and chlamydia, if: You are sexually active and are younger than 61 years of age. You are older than 61 years of age and your   health care provider tells you that you are at risk for this type of infection. Your sexual activity has changed since you were last screened, and you are at increased risk for chlamydia or gonorrhea. Ask your health care provider if you are at risk. Ask your health care provider about whether you are at high risk for HIV. Your health care provider  may recommend a prescription medicine to help prevent HIV infection. If you choose to take medicine to prevent HIV, you should first get tested for HIV. You should then be tested every 3 months for as long as you are taking the medicine. Follow these instructions at home: Alcohol use Do not drink alcohol if your health care provider tells you not to drink. If you drink alcohol: Limit how much you have to 0-2 drinks a day. Know how much alcohol is in your drink. In the [place] one drink equals one 12 oz bottle of beer (355 mL), one 5 oz glass of wine (148 mL), or one 1 oz glass of hard liquor (44 mL). Lifestyle Do not use any products that contain nicotine or tobacco. These products include cigarettes, chewing tobacco, and vaping devices, such as e-cigarettes. If you need help quitting, ask your health care provider. Do not use street drugs. Do not share needles. Ask your health care provider for help if you need support or information about quitting drugs. General instructions Schedule regular health, dental, and eye exams. Stay current with your vaccines. Tell your health care provider if: You often feel depressed. You have ever been abused or do not feel safe at home. Summary Adopting a healthy lifestyle and getting preventive care are important in promoting health and wellness. Follow your health care provider's instructions about healthy diet, exercising, and getting tested or screened for diseases. Follow your health care provider's instructions on monitoring your cholesterol and blood pressure. This information is not intended to replace advice given to you by your health care provider. Make sure you discuss any questions you have with your health care provider. Document Revised: 11/08/2020 Document Reviewed: 11/08/2020 Elsevier Patient Education  2024 Elsevier Inc.  

## 2023-02-23 DIAGNOSIS — I10 Essential (primary) hypertension: Secondary | ICD-10-CM | POA: Insufficient documentation

## 2023-02-23 DIAGNOSIS — Z23 Encounter for immunization: Secondary | ICD-10-CM | POA: Insufficient documentation

## 2023-02-23 LAB — TSH: TSH: 5.27 u[IU]/mL (ref 0.35–5.50)

## 2023-02-23 LAB — PSA: PSA: 1.17 ng/mL (ref 0.10–4.00)

## 2023-02-23 LAB — BRAIN NATRIURETIC PEPTIDE: Pro B Natriuretic peptide (BNP): 17 pg/mL (ref 0.0–100.0)

## 2023-02-23 MED ORDER — ROSUVASTATIN CALCIUM 20 MG PO TABS
20.0000 mg | ORAL_TABLET | Freq: Every day | ORAL | 1 refills | Status: DC
Start: 2023-02-23 — End: 2023-03-06

## 2023-02-23 MED ORDER — OLMESARTAN MEDOXOMIL 20 MG PO TABS: 20.0000 mg | ORAL_TABLET | Freq: Every day | ORAL | 0 refills | Status: DC

## 2023-02-26 ENCOUNTER — Other Ambulatory Visit: Payer: Self-pay | Admitting: Internal Medicine

## 2023-03-01 ENCOUNTER — Telehealth: Payer: Self-pay

## 2023-03-01 NOTE — Telephone Encounter (Signed)
*  Primary  Pharmacy Patient Advocate Encounter   Received notification from CoverMyMeds that prior authorization for Rosuvastatin Calcium 20MG  tablets  is required/requested.   Insurance verification completed.   The patient is insured through [facility] .   Per test claim:  Atorvastatin is preferred by the insurance.  If suggested medication is appropriate, Please send in a new RX and discontinue this one. If not, please advise as to why it's not appropriate so that we may request a Prior Authorization.   KeyDurenda Age

## 2023-03-06 ENCOUNTER — Other Ambulatory Visit: Payer: Self-pay | Admitting: Internal Medicine

## 2023-03-06 DIAGNOSIS — E785 Hyperlipidemia, unspecified: Secondary | ICD-10-CM

## 2023-03-06 DIAGNOSIS — R9431 Abnormal electrocardiogram [ECG] [EKG]: Secondary | ICD-10-CM

## 2023-03-06 DIAGNOSIS — I739 Peripheral vascular disease, unspecified: Secondary | ICD-10-CM

## 2023-03-06 DIAGNOSIS — R072 Precordial pain: Secondary | ICD-10-CM

## 2023-03-06 MED ORDER — ATORVASTATIN CALCIUM 80 MG PO TABS
80.0000 mg | ORAL_TABLET | Freq: Every day | ORAL | 0 refills | Status: DC
Start: 2023-03-06 — End: 2023-07-16

## 2023-03-08 ENCOUNTER — Other Ambulatory Visit: Payer: Self-pay | Admitting: Internal Medicine

## 2023-03-08 DIAGNOSIS — J4489 Other specified chronic obstructive pulmonary disease: Secondary | ICD-10-CM

## 2023-04-26 ENCOUNTER — Ambulatory Visit: Payer: 59 | Attending: Cardiovascular Disease | Admitting: Cardiovascular Disease

## 2023-04-26 ENCOUNTER — Encounter: Payer: Self-pay | Admitting: Cardiovascular Disease

## 2023-04-26 DIAGNOSIS — Z720 Tobacco use: Secondary | ICD-10-CM

## 2023-04-26 DIAGNOSIS — I723 Aneurysm of iliac artery: Secondary | ICD-10-CM | POA: Diagnosis not present

## 2023-04-26 DIAGNOSIS — I10 Essential (primary) hypertension: Secondary | ICD-10-CM

## 2023-04-26 DIAGNOSIS — J4489 Other specified chronic obstructive pulmonary disease: Secondary | ICD-10-CM

## 2023-04-26 DIAGNOSIS — Z8249 Family history of ischemic heart disease and other diseases of the circulatory system: Secondary | ICD-10-CM

## 2023-04-26 DIAGNOSIS — R0602 Shortness of breath: Secondary | ICD-10-CM

## 2023-04-26 DIAGNOSIS — E782 Mixed hyperlipidemia: Secondary | ICD-10-CM

## 2023-04-26 DIAGNOSIS — J431 Panlobular emphysema: Secondary | ICD-10-CM

## 2023-04-26 DIAGNOSIS — E785 Hyperlipidemia, unspecified: Secondary | ICD-10-CM

## 2023-04-26 MED ORDER — ATORVASTATIN CALCIUM 40 MG PO TABS: 40.0000 mg | ORAL_TABLET | Freq: Every day | ORAL | 3 refills | Status: DC

## 2023-04-26 MED ORDER — OLMESARTAN MEDOXOMIL 20 MG PO TABS: 20.0000 mg | ORAL_TABLET | Freq: Every day | ORAL | 0 refills | Status: DC

## 2023-04-26 MED ORDER — ICOSAPENT ETHYL 1 G PO CAPS: 2.0000 g | ORAL_CAPSULE | Freq: Two times a day (BID) | ORAL | 1 refills | Status: DC

## 2023-04-26 MED ORDER — METOPROLOL TARTRATE 100 MG PO TABS: 100.0000 mg | ORAL_TABLET | Freq: Two times a day (BID) | ORAL | 3 refills | Status: DC

## 2023-04-26 NOTE — Progress Notes (Signed)
Cardiology Office Note    Date:  05/03/2023   ID:  Michai, Gonyeau 1961-05-12, MRN [id number]  PCP:  Etta Grandchild, MD  Cardiologist:  Nicki Guadalajara, MD   New cardiology consult referred by Dr. Sanda Linger   History of Present Illness:  Rodney Williams is a 62 y.o. male who has a history of peripheral vascular disease, COPD/emphysema, who had undergone a and a aortoiliac duplex evaluation April 2014 interpreted by Dr. Darryl Lent which showed normal dimension at 2.3 x 2.32 cm.  There was aneurysmal dilatation involving the right distal common iliac artery measuring 1.8 x 1.94 cm.  The remainder of the iliac artery diameters were normal.  Patient has a longstanding history of tobacco use he quit smoking 1 year ago.  He has noted some short-lived sharp chest pain which usually resolves spontaneously.  He does have family history for heart disease with a sister dying of an MI.  He has a history of hyperlipidemia, hypertension, and COPD.  He was on olmesartan 20 mg daily but ran out of this medication 2 weeks ago.  He was on atorvastatin 80 mg but is not taking this.  He supposed to take aspirin 81 mg daily but he states he takes this occasionally.  He also takes as needed a neuro Ellipta for his COPD and as needed albuterol.  He is now referred by Dr. Sanda Linger for cardiac evaluation.  Past Medical History:  Diagnosis Date   Complication of anesthesia    woke up during surgery   COPD (chronic obstructive pulmonary disease) (HCC)    Olecranon bursitis of right elbow    Tobacco abuse    smoke for 40 years    Past Surgical History:  Procedure Laterality Date   INGUINAL HERNIA REPAIR     left   OLECRANON BURSECTOMY Right 12/31/2020   Procedure: RIGHT OLECRANON  BURSITIS EXCISION;  Surgeon: Cammy Copa, MD;  Location: [facility];  Service: Orthopedics;  Laterality: Right;   ULNAR NERVE REPAIR     left   ulnar nerve surgery  per pt. 7-8 years ago   Left  elbow and wrist    Current Medications: Outpatient Medications Prior to Visit  Medication Sig Dispense Refill   albuterol (VENTOLIN HFA) 108 (90 Base) MCG/ACT inhaler INHALE 2 PUFFS INTO LUNGS EVERY 6 HOURS AS NEEDED FOR SHORTNESS OF BREATH/WHEEZING 18 each 2   aspirin EC 81 MG tablet Take 1 tablet (81 mg total) by mouth daily. Swallow whole. 90 tablet 1   atorvastatin (LIPITOR) 80 MG tablet Take 1 tablet (80 mg total) by mouth daily. 90 tablet 0   umeclidinium-vilanterol (ANORO ELLIPTA) 62.5-25 MCG/ACT AEPB INHALE 1 PUFF INTO THE LUNGS DAILY AT 6 (SIX) AM. 120 each 0   olmesartan (BENICAR) 20 MG tablet Take 1 tablet (20 mg total) by mouth daily. 90 tablet 0   No facility-administered medications prior to visit.     Allergies:   Patient has no known allergies.   Social History   Socioeconomic History   Marital status: Single    Spouse name: Not on file   Number of children: 0   Years of education: Not on file   Highest education level: Not on file  Occupational History   Occupation: Market researcher: [employer]  Tobacco Use   Smoking status: Former    Current packs/day: 1.50    Average packs/day: 1.5 packs/day for 40.0 years (  60.0 ttl pk-yrs)    Types: Cigarettes   Smokeless tobacco: Never  Vaping Use   Vaping status: Never Used  Substance and Sexual Activity   Alcohol use: No   Drug use: No   Sexual activity: Yes    Partners: Female  Other Topics Concern   Not on file  Social History Narrative   Not on file   Social Determinants of Health   Financial Resource Strain: Not on file  Food Insecurity: Not on file  Transportation Needs: Not on file  Physical Activity: Not on file  Stress: Not on file  Social Connections: Not on file    Socially he was born in [facility].  He is single.  He had done bathroom remodeling.  He completed 12th grade of education.  He had smoked for greater than 50 years but quit smoking April 26, 2022.  He does  occasional exercise days per week.  He is no illicit drug use.  Family History: Both parents are living at 65.  Father has diabetes.  His sister died at age 76 of possible myocardial infarction.  ROS General: Negative; No fevers, chills, or night sweats;  HEENT: Negative; No changes in vision or hearing, sinus congestion, difficulty swallowing Pulmonary: COPD Cardiovascular: See HPI GI: Negative; No nausea, vomiting, diarrhea, or abdominal pain GU: Negative; No dysuria, hematuria, or difficulty voiding Musculoskeletal: Negative; no myalgias, joint pain, or weakness Hematologic/Oncology: Negative; no easy bruising, bleeding Endocrine: Negative; no heat/cold intolerance; no diabetes Neuro: Negative; no changes in balance, headaches Skin: Negative; No rashes or skin lesions Psychiatric: Negative; No behavioral problems, depression Sleep: Negative; No snoring, daytime sleepiness, hypersomnolence, bruxism, restless legs, hypnogognic hallucinations, no cataplexy Other comprehensive 14 point system review is negative.   PHYSICAL EXAM:   VS:  BP (!) 124/92   Pulse 99   Ht 6\' 4"  (1.93 m)   Wt 245 lb (111.1 kg)   SpO2 94%   BMI 29.82 kg/m     Repeat blood pressure by me was 138/90  Wt Readings from Last 3 Encounters:  04/26/23 245 lb (111.1 kg)  02/22/23 239 lb 9.6 oz (108.7 kg)  11/17/22 234 lb 11.2 oz (106.5 kg)    General: Alert, oriented, no distress.  Skin: normal turgor, no rashes, warm and dry HEENT: Normocephalic, atraumatic. Pupils equal round and reactive to light; sclera anicteric; extraocular muscles intact;  Nose without nasal septal hypertrophy Mouth/Parynx benign; Mallinpatti scale 3 Neck: No JVD, no carotid bruits; normal carotid upstroke Lungs: Creased breath sounds without wheezing Chest wall: without tenderness to palpitation Heart: PMI not displaced, RRR, s1 s2 normal, 1/6 systolic murmur, no diastolic murmur, no rubs, gallops, thrills, or heaves Abdomen: soft,  nontender; no hepatosplenomehaly, BS+; abdominal aorta nontender and not dilated by palpation. Back: no CVA tenderness Pulses 2+ Musculoskeletal: full range of motion, normal strength, no joint deformities Extremities: no clubbing cyanosis or edema, Homan's sign negative  Neurologic: grossly nonfocal; Cranial nerves grossly wnl Psychologic: Normal mood and affect   Studies/Labs Reviewed:   EKG Interpretation Date/Time:  Thursday April 26 2023 14:38:55 EDT Ventricular Rate:  99 PR Interval:  148 QRS Duration:  84 QT Interval:  340 QTC Calculation: 436 R Axis:   81  Text Interpretation: Normal sinus rhythm Normal ECG No previous ECGs available Confirmed by Nicki Guadalajara ([id number]) on 04/26/2023 2:54:16 PM    Recent Labs:    Latest Ref Rng & Units 04/30/2023    8:52 AM 02/22/2023   10:46 AM  11/17/2022    2:20 PM  BMP  Glucose 70 - 99 mg/dL 87  92  914   BUN 8 - 27 mg/dL 20  17  16    Creatinine 0.76 - 1.27 mg/dL 7.82  9.56  2.13   BUN/Creat Ratio 10 - 24 14     Sodium 134 - 144 mmol/L 140  137  138   Potassium 3.5 - 5.2 mmol/L 5.3  4.3  4.2   Chloride 96 - 106 mmol/L 101  99  99   CO2 20 - 29 mmol/L 25  33  34   Calcium 8.6 - 10.2 mg/dL 9.3  9.4  9.2         Latest Ref Rng & Units 04/30/2023    8:52 AM 11/17/2022    2:20 PM 05/04/2022    1:47 PM  Hepatic Function  Total Protein 6.0 - 8.5 g/dL 6.6  6.9  6.9   Albumin 3.9 - 4.9 g/dL 4.4  4.4  4.1   AST 0 - 40 IU/L 20  19  14    ALT 0 - 44 IU/L 32  27  18   Alk Phosphatase 44 - 121 IU/L 63  58  55   Total Bilirubin 0.0 - 1.2 mg/dL 0.5  0.4  0.5        Latest Ref Rng & Units 02/22/2023   10:46 AM 11/17/2022    2:20 PM 05/04/2022    1:47 PM  CBC  WBC 4.0 - 10.5 K/uL 5.5  6.4  6.4   Hemoglobin 13.0 - 17.0 g/dL 08.6  57.8  46.9   Hematocrit 39.0 - 52.0 % 49.9  50.8  56.5   Platelets 150.0 - 400.0 K/uL 192.0  174  166    Lab Results  Component Value Date   MCV 90.7 02/22/2023   MCV 88.2 11/17/2022   MCV 89.8  05/04/2022   Lab Results  Component Value Date   TSH 5.27 02/22/2023   Lab Results  Component Value Date   HGBA1C 5.8 02/22/2023     BNP No results found for: "BNP"  ProBNP    Component Value Date/Time   PROBNP 17.0 02/22/2023 1046     Lipid Panel     Component Value Date/Time   CHOL 150 04/30/2023 0852   TRIG 194 (H) 04/30/2023 0852   HDL 32 (L) 04/30/2023 0852   CHOLHDL 4.7 04/30/2023 0852   CHOLHDL 5 02/22/2023 1046   VLDL 79.0 (H) 02/22/2023 1046   LDLCALC 85 04/30/2023 0852   LDLDIRECT 128.0 02/22/2023 1046   LABVLDL 33 04/30/2023 0852     RADIOLOGY: No results found.   Additional studies/ records that were reviewed today include:  I reviewed the patient's prior aortoiliac duplex evaluation from October 28, 2012.  Records of Dr. Sanda Linger were reviewed.   ASSESSMENT:    1. Primary hypertension   2. Aneurysm of iliac artery (HCC)   3. Mixed hyperlipidemia   4. Tobacco abuse   5. SOB (shortness of breath)   6. COPD (chronic obstructive pulmonary disease) with chronic bronchitis (HCC)   7. Panlobular emphysema (HCC)   8. Family history of heart attack    PLAN:  Rodney Williams is a 62 year old male who has a greater than 50-year history of tobacco use and fortunately quit 1 year ago.  He has a history of hypertension, hyperlipidemia, COPD, and in 2014 was found to have mild aneurysmal dilatation of his distal common iliac artery measuring 1.8 x 1.94  cm.  He apparently ran out of his olmesartan 20 mg which he was taking for hypertension.  Blood pressure today is elevated and on repeat by me was 138/90.  He also has has a history of hyperlipidemia and supposed to be taking atorvastatin 80 mg but has not been taking this medication.  Lipid panel in February 22, 2023 showed total cholesterol 186, triglycerides were markedly elevated at 395, HDL was low at 34.7, VLDL increased at 79 and direct LDL was 128.  As experienced sharp nonexertional chest pain  which is short-lived which does not sound ischemic in etiology.  He admits to a 10 pound weight gain since he quit smoking 1 year ago.  Presently, I am recommending he undergo a 2D echo Doppler study for evaluation of systolic and diastolic function.  I am scheduled him for abdominal aortic/iliac duplex scan with follow-up of the study that he had had in 2014.  With his history of hyperlipidemia, family history for CAD, and his recent sharp chest pain, I am scheduling him to undergo coronary CTA.  He has significant mixed hyperlipidemia.  I have recommended he resume atorvastatin at 40 mg and will initiate Vascepa 2 capsules twice a day.  I am also recommending resumption of olmesartan 20 mg with his diastolic hypertension.  I will check an LP(a).  In 6 to 8 weeks I am recommending complete follow-up fasting comprehensive metabolic panel, fasting lipid panel and his LP(a) will be checked at that time.  Will see him in follow-up for reassessment and further recommendations.   Medication Adjustments/Labs and Tests Ordered: Current medicines are reviewed at length with the patient today.  Concerns regarding medicines are outlined above.  Medication changes, Labs and Tests ordered today are listed in the Patient Instructions below. Patient Instructions  Medication Instructions:  RESUME ATORVASTATIN 40MG  DAILY.  BEGIN VASCEPA 1 GRAM TWICE DAILY.  BEGIN OLMESARTAN 20MG  DAILY.   *If you need a refill on your cardiac medications before your next appointment, please call your pharmacy*   Lab Work: RETURN FOR FASTING LABS....................   CMET, LIPID, LPA If you have labs (blood work) drawn today and your tests are completely normal, you will receive your results only by: MyChart Message (if you have MyChart) OR A paper copy in the mail If you have any lab test that is abnormal or we need to change your treatment, we will call you to review the results.   Testing/Procedures: Your physician has  requested that you have an echocardiogram. Echocardiography is a painless test that uses sound waves to create images of your heart. It provides your doctor with information about the size and shape of your heart and how well your heart's chambers and valves are working. This procedure takes approximately one hour. There are no restrictions for this procedure. Please do NOT wear cologne, perfume, aftershave, or lotions (deodorant is allowed). Please arrive 15 minutes prior to your appointment time.          Your cardiac CT will be scheduled at one of the below locations:   [facility] [address] [place], [place] [address] [phone]  OR  [facility] [address] [place], [place] [address] [phone]  OR   [facility] [address] [place], [place] [address] [phone]  If scheduled at [facility], please arrive at the [facility] and Children's Entrance (Entrance C2) of [facility] 30 minutes prior to test start  time. You can use the FREE valet parking offered at entrance C (encouraged to control the heart rate for the test)  Proceed to the [facility] Radiology Department (first floor) to check-in and test prep.  All radiology patients and guests should use entrance C2 at [facility], accessed from [facility], even though the hospital's physical address listed is [address]    If scheduled at [facility] or [facility], please arrive 15 mins early for check-in and test prep.  There is spacious parking and easy access to the radiology department from the [facility] Heart and Vascular entrance. Please enter here and check-in with the desk attendant.   Please follow these instructions carefully (unless otherwise directed):  An IV will be required for this test and Nitroglycerin will be given.   Hold all erectile dysfunction medications at least 3 days (72 hrs) prior to test. (Ie viagra, cialis, sildenafil, tadalafil, etc)   On the Night Before the Test: Be sure to Drink plenty of water. Do not consume any caffeinated/decaffeinated beverages or chocolate 12 hours prior to your test. Do not take any antihistamines 12 hours prior to your test. If the patient has contrast allergy: Patient will need a prescription for Prednisone and very clear instructions (as follows): Prednisone 50 mg - take 13 hours prior to test Take another Prednisone 50 mg 7 hours prior to test Take another Prednisone 50 mg 1 hour prior to test Take Benadryl 50 mg 1 hour prior to test Patient must complete all four doses of above prophylactic medications. Patient will need a ride after test due to Benadryl.  On the Day of the Test: Drink plenty of water until 1 hour prior to the test. Do not eat any food 1 hour prior to test. You may take your regular medications prior to the test.  Take metoprolol (Lopressor) two hours prior to test. If you take Furosemide/Hydrochlorothiazide/Spironolactone, please HOLD on the morning of the test. FEMALES- please wear underwire-free bra if available, avoid dresses & tight clothing      After the Test: Drink plenty of water. After receiving IV contrast, you may experience a mild flushed feeling. This is normal. On occasion, you may experience a mild rash up to 24 hours after the test. This is not dangerous. If this occurs, you can take Benadryl 25 mg and increase your fluid intake. If you experience trouble breathing, this can be serious. If it is severe call 911 IMMEDIATELY. If it is mild, please call our office. If you take any of these medications: Glipizide/Metformin, Avandament, Glucavance, please do not take 48 hours after completing test unless otherwise instructed.  We will call to schedule your test 2-4 weeks out understanding that some insurance companies will  need an authorization prior to the service being performed.   For more information and frequently asked questions, please visit our website : [email or web address]  For non-scheduling related questions, please contact the cardiac imaging nurse navigator should you have any questions/concerns: Cardiac Imaging Nurse Navigators Direct Office Dial: [phone]   For scheduling needs, including cancellations and rescheduling, please call [place], [phone].      Your physician has requested that you have an abdominal aorta duplex. During this test, an ultrasound is used to evaluate the aorta. Allow 30 minutes for this exam. Do not eat after midnight the day before and avoid carbonated beverages   Follow-Up: At [facility], you and your health needs are our priority.  As part of our continuing mission to provide you with exceptional heart care, we have created designated Provider Care Teams.  These Care Teams include your primary Cardiologist (physician) and Advanced Practice Providers (APPs -  Physician Assistants and Nurse Practitioners) who all work together to provide you with the care you need, when you need it.  We recommend signing up for the patient portal called "MyChart".  Sign up information is provided on this After Visit Summary.  MyChart is used to connect with patients for Virtual Visits (Telemedicine).  Patients are able to view lab/test results, encounter notes, upcoming appointments, etc.  Non-urgent messages can be sent to your provider as well.   To learn more about what you can do with MyChart, go to [email or web address].    Your next appointment:   2 month(s)  Provider:   DR. Nicki Guadalajara     Signed, Nicki Guadalajara, MD  05/03/2023 12:45 PM    [facility] [address], [place], [place]  [address] Phone: [phone]

## 2023-04-26 NOTE — Patient Instructions (Addendum)
Medication Instructions:  RESUME ATORVASTATIN 40MG  DAILY.  BEGIN VASCEPA 1 GRAM TWICE DAILY.  BEGIN OLMESARTAN 20MG  DAILY.   *If you need a refill on your cardiac medications before your next appointment, please call your pharmacy*   Lab Work: RETURN FOR FASTING LABS....................   CMET, LIPID, LPA If you have labs (blood work) drawn today and your tests are completely normal, you will receive your results only by: MyChart Message (if you have MyChart) OR A paper copy in the mail If you have any lab test that is abnormal or we need to change your treatment, we will call you to review the results.   Testing/Procedures: Your physician has requested that you have an echocardiogram. Echocardiography is a painless test that uses sound waves to create images of your heart. It provides your doctor with information about the size and shape of your heart and how well your heart's chambers and valves are working. This procedure takes approximately one hour. There are no restrictions for this procedure. Please do NOT wear cologne, perfume, aftershave, or lotions (deodorant is allowed). Please arrive 15 minutes prior to your appointment time.          Your cardiac CT will be scheduled at one of the below locations:   [facility] [address] [place], [place] [address] [phone]  OR  [facility] [address] [place], [place] [address] [phone]  OR   [facility] [address] [place], [place] [address] [phone]  If scheduled at [facility], please arrive at the [facility] and Children's Entrance (Entrance C2) of [facility] 30 minutes prior to test start time. You can use the FREE valet parking offered at entrance C (encouraged to control the heart rate for the test)  Proceed to the [facility] Radiology Department (first floor) to check-in and test  prep.  All radiology patients and guests should use entrance C2 at [facility], accessed from [facility], even though the hospital's physical address listed is [address].    If scheduled at [facility] or [facility], please arrive 15 mins early for check-in and test prep.  There is spacious parking and easy access to the radiology department from the [facility] Heart and Vascular entrance. Please enter here and check-in with the desk attendant.   Please follow these instructions carefully (unless otherwise directed):  An IV will be required for this test and Nitroglycerin will be given.  Hold all erectile dysfunction medications at least 3 days (72 hrs) prior to test. (Ie viagra, cialis, sildenafil, tadalafil, etc)   On the Night Before the Test: Be sure to Drink plenty of water. Do not consume any caffeinated/decaffeinated beverages or chocolate 12 hours prior to your test. Do not take any antihistamines 12 hours prior to your test. If the patient has contrast allergy: Patient will need a prescription for Prednisone and very clear instructions (as follows): Prednisone 50 mg - take 13 hours prior to test Take another Prednisone 50 mg 7 hours prior to test Take another Prednisone 50 mg 1 hour prior to test Take Benadryl 50 mg 1 hour prior to test Patient must complete all four doses of above prophylactic medications. Patient will need a ride after test due to Benadryl.  On the Day of the Test: Drink plenty of water until 1 hour prior to the test. Do not eat any food 1 hour  prior to test. You may take your regular medications prior to the test.  Take metoprolol (Lopressor) two hours prior to test. If you take Furosemide/Hydrochlorothiazide/Spironolactone, please HOLD on the morning of the test. FEMALES- please wear underwire-free bra if available, avoid dresses & tight clothing      After the Test: Drink  plenty of water. After receiving IV contrast, you may experience a mild flushed feeling. This is normal. On occasion, you may experience a mild rash up to 24 hours after the test. This is not dangerous. If this occurs, you can take Benadryl 25 mg and increase your fluid intake. If you experience trouble breathing, this can be serious. If it is severe call 911 IMMEDIATELY. If it is mild, please call our office. If you take any of these medications: Glipizide/Metformin, Avandament, Glucavance, please do not take 48 hours after completing test unless otherwise instructed.  We will call to schedule your test 2-4 weeks out understanding that some insurance companies will need an authorization prior to the service being performed.   For more information and frequently asked questions, please visit our website : [email or web address]  For non-scheduling related questions, please contact the cardiac imaging nurse navigator should you have any questions/concerns: Cardiac Imaging Nurse Navigators Direct Office Dial: [phone]   For scheduling needs, including cancellations and rescheduling, please call [place], [phone].      Your physician has requested that you have an abdominal aorta duplex. During this test, an ultrasound is used to evaluate the aorta. Allow 30 minutes for this exam. Do not eat after midnight the day before and avoid carbonated beverages   Follow-Up: At [facility], you and your health needs are our priority.  As part of our continuing mission to provide you with exceptional heart care, we have created designated Provider Care Teams.  These Care Teams include your primary Cardiologist (physician) and Advanced Practice Providers (APPs -  Physician Assistants and Nurse Practitioners) who all work together to provide you with the care you need, when you need it.  We recommend signing up for the patient portal called "MyChart".  Sign up information is  provided on this After Visit Summary.  MyChart is used to connect with patients for Virtual Visits (Telemedicine).  Patients are able to view lab/test results, encounter notes, upcoming appointments, etc.  Non-urgent messages can be sent to your provider as well.   To learn more about what you can do with MyChart, go to [email or web address].    Your next appointment:   2 month(s)  Provider:   DR. Nicki Guadalajara

## 2023-04-30 ENCOUNTER — Other Ambulatory Visit: Payer: Self-pay

## 2023-04-30 DIAGNOSIS — I723 Aneurysm of iliac artery: Secondary | ICD-10-CM

## 2023-04-30 DIAGNOSIS — E785 Hyperlipidemia, unspecified: Secondary | ICD-10-CM

## 2023-04-30 DIAGNOSIS — I10 Essential (primary) hypertension: Secondary | ICD-10-CM

## 2023-04-30 DIAGNOSIS — Z720 Tobacco use: Secondary | ICD-10-CM

## 2023-04-30 DIAGNOSIS — R0602 Shortness of breath: Secondary | ICD-10-CM

## 2023-05-02 ENCOUNTER — Other Ambulatory Visit (HOSPITAL_COMMUNITY): Payer: Self-pay

## 2023-05-02 ENCOUNTER — Telehealth: Payer: Self-pay | Admitting: Pharmacy Technician

## 2023-05-02 NOTE — Telephone Encounter (Signed)
Pharmacy Patient Advocate Encounter   Received notification from CoverMyMeds that prior authorization for icosapent is required/requested.   Insurance verification completed.   The patient is insured through [facility] .   Per test claim: PA required; PA submitted to above mentioned insurance via CoverMyMeds Key/confirmation #/EOC [id number] Status is pending

## 2023-05-03 ENCOUNTER — Encounter: Payer: Self-pay | Admitting: Cardiovascular Disease

## 2023-05-03 LAB — COMPREHENSIVE METABOLIC PANEL
ALT: 32 IU/L (ref 0–44)
AST: 20 [IU]/L (ref 0–40)
Albumin: 4.4 g/dL (ref 3.9–4.9)
Alkaline Phosphatase: 63 IU/L (ref 44–121)
BUN/Creatinine Ratio: 14 (ref 10–24)
BUN: 20 mg/dL (ref 8–27)
Bilirubin Total: 0.5 mg/dL (ref 0.0–1.2)
CO2: 25 mmol/L (ref 20–29)
Calcium: 9.3 mg/dL (ref 8.6–10.2)
Chloride: 101 mmol/L (ref 96–106)
Creatinine, Ser: 1.45 mg/dL — ABNORMAL HIGH (ref 0.76–1.27)
Globulin, Total: 2.2 g/dL (ref 1.5–4.5)
Glucose: 87 mg/dL (ref 70–99)
Potassium: 5.3 mmol/L — ABNORMAL HIGH (ref 3.5–5.2)
Sodium: 140 mmol/L (ref 134–144)
Total Protein: 6.6 g/dL (ref 6.0–8.5)
eGFR: 55 mL/min/{1.73_m2} — ABNORMAL LOW (ref >59)

## 2023-05-03 LAB — LIPOPROTEIN A (LPA): Lipoprotein (a): 19.3 nmol/L (ref <75.0)

## 2023-05-03 LAB — LIPID PANEL
Chol/HDL Ratio: 4.7 ratio (ref 0.0–5.0)
Cholesterol, Total: 150 mg/dL (ref 100–199)
HDL: 32 mg/dL — ABNORMAL LOW (ref >39)
LDL Chol Calc (NIH): 85 mg/dL (ref 0–99)
Triglycerides: 194 mg/dL — ABNORMAL HIGH (ref 0–149)
VLDL Cholesterol Cal: 33 mg/dL (ref 5–40)

## 2023-05-04 ENCOUNTER — Other Ambulatory Visit: Payer: Self-pay | Admitting: *Deleted

## 2023-05-04 DIAGNOSIS — I10 Essential (primary) hypertension: Secondary | ICD-10-CM

## 2023-05-04 DIAGNOSIS — I739 Peripheral vascular disease, unspecified: Secondary | ICD-10-CM

## 2023-05-07 NOTE — Telephone Encounter (Signed)
Pharmacy Patient Advocate Encounter  Received notification from [facility] that Prior Authorization for icosapent has been DENIED.  Full denial letter will be uploaded to the media tab. See denial reason below.   PA #/Case ID/Reference #: [id number]

## 2023-05-08 ENCOUNTER — Other Ambulatory Visit: Payer: Self-pay

## 2023-05-08 ENCOUNTER — Ambulatory Visit (HOSPITAL_COMMUNITY)
Admission: RE | Admit: 2023-05-08 | Discharge: 2023-05-08 | Disposition: A | Discharge status: Home / Self Care | Payer: 59 | Source: Ambulatory Visit | Attending: Cardiovascular Disease | Admitting: Cardiovascular Disease

## 2023-05-08 DIAGNOSIS — I10 Essential (primary) hypertension: Secondary | ICD-10-CM

## 2023-05-08 DIAGNOSIS — Z720 Tobacco use: Secondary | ICD-10-CM

## 2023-05-08 DIAGNOSIS — I739 Peripheral vascular disease, unspecified: Secondary | ICD-10-CM

## 2023-05-08 DIAGNOSIS — I723 Aneurysm of iliac artery: Secondary | ICD-10-CM | POA: Diagnosis not present

## 2023-05-08 DIAGNOSIS — R0602 Shortness of breath: Secondary | ICD-10-CM | POA: Diagnosis not present

## 2023-05-09 ENCOUNTER — Other Ambulatory Visit (HOSPITAL_COMMUNITY): Payer: Self-pay | Admitting: *Deleted

## 2023-05-09 ENCOUNTER — Telehealth: Payer: Self-pay | Admitting: *Deleted

## 2023-05-09 DIAGNOSIS — I739 Peripheral vascular disease, unspecified: Secondary | ICD-10-CM

## 2023-05-09 DIAGNOSIS — I723 Aneurysm of iliac artery: Secondary | ICD-10-CM

## 2023-05-09 LAB — BASIC METABOLIC PANEL
BUN/Creatinine Ratio: 13 (ref 10–24)
BUN: 17 mg/dL (ref 8–27)
CO2: 29 mmol/L (ref 20–29)
Calcium: 9.2 mg/dL (ref 8.6–10.2)
Chloride: 99 mmol/L (ref 96–106)
Creatinine, Ser: 1.27 mg/dL (ref 0.76–1.27)
Glucose: 88 mg/dL (ref 70–99)
Potassium: 4.9 mmol/L (ref 3.5–5.2)
Sodium: 139 mmol/L (ref 134–144)
eGFR: 64 mL/min/{1.73_m2} (ref >59)

## 2023-05-09 NOTE — Telephone Encounter (Signed)
-----   Message from Nicki Guadalajara sent at 05/08/2023  7:09 PM EST ----- There is now significant dilation of the aorta and right common iliac artery with the largest aortic measurement at 5 cm which has significantly increased from 2.3 cm in April 2014.  The right common iliac artery diameter has increased to 4.6 cm from 1.9 cm in April 2014.  Please refer the patient to vascular surgery with an appointment as soon as possible.

## 2023-05-09 NOTE — Telephone Encounter (Signed)
The patient has been notified of the result and verbalized understanding.  All questions (if any) were answered.  Urgent Referral placed to vascular surgery   Tobin Chad, RN 05/09/2023 10:27 AM

## 2023-05-11 ENCOUNTER — Ambulatory Visit (HOSPITAL_COMMUNITY): Payer: 59

## 2023-05-25 ENCOUNTER — Ambulatory Visit (HOSPITAL_COMMUNITY): Payer: 59 | Attending: Cardiovascular Disease

## 2023-05-25 DIAGNOSIS — Z720 Tobacco use: Secondary | ICD-10-CM | POA: Diagnosis present

## 2023-05-25 DIAGNOSIS — I10 Essential (primary) hypertension: Secondary | ICD-10-CM | POA: Insufficient documentation

## 2023-05-25 DIAGNOSIS — R0602 Shortness of breath: Secondary | ICD-10-CM | POA: Diagnosis present

## 2023-05-25 LAB — ECHOCARDIOGRAM COMPLETE
Area-P 1/2: 2.62 cm2
S' Lateral: 3.4 cm

## 2023-05-25 MED ORDER — PERFLUTREN LIPID MICROSPHERE
3.0000 mL | INTRAVENOUS | Status: AC | PRN
  Administered 2023-05-25: 3 mL via INTRAVENOUS

## 2023-05-27 ENCOUNTER — Other Ambulatory Visit: Payer: Self-pay | Admitting: Internal Medicine

## 2023-05-27 DIAGNOSIS — J4489 Other specified chronic obstructive pulmonary disease: Secondary | ICD-10-CM

## 2023-06-14 ENCOUNTER — Other Ambulatory Visit (HOSPITAL_COMMUNITY): Payer: Self-pay

## 2023-06-20 ENCOUNTER — Other Ambulatory Visit: Payer: Self-pay | Admitting: Internal Medicine

## 2023-06-21 ENCOUNTER — Ambulatory Visit (INDEPENDENT_AMBULATORY_CARE_PROVIDER_SITE_OTHER): Payer: 59 | Admitting: Surgery

## 2023-06-21 ENCOUNTER — Other Ambulatory Visit: Payer: Self-pay

## 2023-06-21 ENCOUNTER — Encounter: Payer: Self-pay | Admitting: Surgery

## 2023-06-21 VITALS — BP 133/89 | HR 83 | Temp 97.9°F | Resp 20 | Ht 76.0 in | Wt 247.8 lb

## 2023-06-21 DIAGNOSIS — I7143 Infrarenal abdominal aortic aneurysm, without rupture: Secondary | ICD-10-CM

## 2023-06-21 DIAGNOSIS — I739 Peripheral vascular disease, unspecified: Secondary | ICD-10-CM

## 2023-06-21 DIAGNOSIS — Z87891 Personal history of nicotine dependence: Secondary | ICD-10-CM

## 2023-06-21 NOTE — Progress Notes (Signed)
Vascular and Vein Specialist of [place]  Patient name: Rodney Williams MRN: [id number] DOB: 28-Jun-1961 Sex: male   REQUESTING PROVIDER:    Dr. Tresa Endo   REASON FOR CONSULT:    Aneurysm  HISTORY OF PRESENT ILLNESS:   Rodney Williams is a 62 y.o. male, who is here today for evaluation of an abdominal and right common iliac aneurysm.  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  He was scheduled for follow-up he never returned.  He told me he lost his job and had issues that kept him from coming back.  He recently had a ultrasound that showed a 5 cm abdominal aneurysm and 4.6 cm right iliac aneurysm.  He denies any abdominal pain.  He is a current smoker.  He takes a statin for hypercholesterolemia.  He is on inhalers for COPD from tobacco abuse.  He is medically managed for hypertension  PAST MEDICAL HISTORY    Past Medical History:  Diagnosis Date   Complication of anesthesia    woke up during surgery   COPD (chronic obstructive pulmonary disease) (HCC)    Hyperlipidemia    Olecranon bursitis of right elbow    Tobacco abuse    smoke for 40 years     FAMILY HISTORY   Family History  Problem Relation Age of Onset   Diabetes Father    Alcohol abuse Neg Hx    Cancer Neg Hx    Early death Neg Hx    Heart disease Neg Hx    Hyperlipidemia Neg Hx    Hypertension Neg Hx    Stroke Neg Hx     SOCIAL HISTORY:   Social History   Socioeconomic History   Marital status: Single    Spouse name: Not on file   Number of children: 0   Years of education: Not on file   Highest education level: Not on file  Occupational History   Occupation: Market researcher: [employer]  Tobacco Use   Smoking status: Former    Current packs/day: 1.50    Average packs/day: 1.5 packs/day for 40.0 years (60.0 ttl pk-yrs)    Types: Cigarettes   Smokeless tobacco: Never  Vaping Use   Vaping status: Never Used  Substance  and Sexual Activity   Alcohol use: No   Drug use: No   Sexual activity: Yes    Partners: Female  Other Topics Concern   Not on file  Social History Narrative   Not on file   Social Drivers of Health   Financial Resource Strain: Not on file  Food Insecurity: Not on file  Transportation Needs: Not on file  Physical Activity: Not on file  Stress: Not on file  Social Connections: Not on file  Intimate Partner Violence: Not on file    ALLERGIES:    No Known Allergies  CURRENT MEDICATIONS:    Current Outpatient Medications  Medication Sig Dispense Refill   albuterol (VENTOLIN HFA) 108 (90 Base) MCG/ACT inhaler INHALE 2 PUFFS INTO LUNGS EVERY 6 HOURS AS NEEDED FOR SHORTNESS OF BREATH/WHEEZING 18 each 2   ANORO ELLIPTA 62.5-25 MCG/ACT AEPB INHALE 1 PUFF INTO THE LUNGS DAILY AT 6 (SIX) AM. 60 each 1   aspirin EC 81 MG tablet Take 1 tablet (81 mg total) by mouth daily. Swallow whole. 90 tablet 1   icosapent Ethyl (VASCEPA) 1 g capsule Take 2 capsules (2 g total) by mouth 2 (two) times  daily. 120 capsule 1   atorvastatin (LIPITOR) 40 MG tablet Take 1 tablet (40 mg total) by mouth daily. (Patient not taking: Reported on 06/21/2023) 90 tablet 3   atorvastatin (LIPITOR) 80 MG tablet Take 1 tablet (80 mg total) by mouth daily. (Patient not taking: Reported on 06/21/2023) 90 tablet 0   metoprolol tartrate (LOPRESSOR) 100 MG tablet Take 1 tablet (100 mg total) by mouth 2 (two) times daily. (Patient not taking: Reported on 06/21/2023) 180 tablet 3   olmesartan (BENICAR) 20 MG tablet Take 1 tablet (20 mg total) by mouth daily. (Patient not taking: Reported on 06/21/2023) 90 tablet 0   No current facility-administered medications for this visit.    REVIEW OF SYSTEMS:   [X]  denotes positive finding, [ ]  denotes negative finding Cardiac  Comments:  Chest pain or chest pressure:    Shortness of breath upon exertion:    Short of breath when lying flat:    Irregular heart rhythm:         Vascular    Pain in calf, thigh, or hip brought on by ambulation:    Pain in feet at night that wakes you up from your sleep:     Blood clot in your veins:    Leg swelling:         Pulmonary    Oxygen at home:    Productive cough:     Wheezing:         Neurologic    Sudden weakness in arms or legs:     Sudden numbness in arms or legs:     Sudden onset of difficulty speaking or slurred speech:    Temporary loss of vision in one eye:     Problems with dizziness:         Gastrointestinal    Blood in stool:      Vomited blood:         Genitourinary    Burning when urinating:     Blood in urine:        Psychiatric    Major depression:         Hematologic    Bleeding problems:    Problems with blood clotting too easily:        Skin    Rashes or ulcers:        Constitutional    Fever or chills:     PHYSICAL EXAM:   Vitals:   06/21/23 1000  BP: 133/89  Pulse: 83  Resp: 20  Temp: 97.9 F (36.6 C)  SpO2: 92%  Weight: 247 lb 12.8 oz (112.4 kg)  Height: 6\' 4"  (1.93 m)    GENERAL: The patient is a well-nourished male, in no acute distress. The vital signs are documented above. CARDIAC: There is a regular rate and rhythm.  VASCULAR: Nonpalpable pedal pulses PULMONARY: Nonlabored respirations ABDOMEN: Soft and non-tender MUSCULOSKELETAL: There are no major deformities or cyanosis. NEUROLOGIC: No focal weakness or paresthesias are detected. SKIN: There are no ulcers or rashes noted. PSYCHIATRIC: The patient has a normal affect.  STUDIES:   I have reviewed his ultrasound with the following findings: Abdominal Aorta: There is evidence of abnormal dilatation of the mid and  distal Abdominal aorta. There is evidence of abnormal dilation of the  distal Right Common Iliac artery. The largest aortic measurement is 5.0  cm. The largest aortic diameter has  increased compared to prior exam. Previous diameter measurement was 2.3 cm  obtained on 10/28/12. The right  common iliac  artery diameter has increased  compared to prior exam. Previous measurement was 1.9 cm obtained on  10/28/12 compared to 4.6 cm on today's   exam.  Stenosis: +------------------+-------------+  Location          Stenosis       +------------------+-------------+  Right Common Iliac>50% stenosis  +------------------+-------------+   Aorta-iliac atherosclerosis. Thrombus noted in the mid and distal aorta.   IVC/Iliac: There is no evidence of thrombus involving the IVC.   ASSESSMENT and PLAN   AAA: Ultrasound suggest significant increase in size of his abdominal as well as right common iliac aneurysm.  I discussed that I need to get a CT scan to better define his anatomy to determine treatment options.  I will work on getting this done in the near future and have him return.  When he returns I will get a carotid duplex and ABIs for baseline preoperative studies.   Charlena Cross, MD, FACS [facility] and Vein Specialists of [facility] [phone] Pager [phone]

## 2023-06-22 ENCOUNTER — Telehealth: Payer: Self-pay | Admitting: Cardiovascular Disease

## 2023-06-22 NOTE — Telephone Encounter (Signed)
Called and spoke to patient . Below results from ECHO per Dr Tresa Endo. No questions at this time.   Lennette Bihari, MD 06/10/2023  4:50 PM EST   Normal LV function with mild LVH and grade mild grade 1 diastolic dysfunction.  Valves normal.  Mild dilation of aortic root at 41 mm.

## 2023-06-22 NOTE — Telephone Encounter (Signed)
Patient had an echo in November but never received a call in regards to the results. He has an appt in January and wants to know if he should keep it.

## 2023-06-29 NOTE — Addendum Note (Signed)
Addended by: Leilani Able, Cornelio Parkerson A on: 06/29/2023 03:30 PM   Modules accepted: Orders

## 2023-07-06 ENCOUNTER — Ambulatory Visit (HOSPITAL_COMMUNITY): Payer: 59

## 2023-07-09 ENCOUNTER — Ambulatory Visit (INDEPENDENT_AMBULATORY_CARE_PROVIDER_SITE_OTHER)
Admission: RE | Admit: 2023-07-09 | Discharge: 2023-07-09 | Disposition: A | Discharge status: Home / Self Care | Payer: 59 | Source: Ambulatory Visit | Attending: Surgery | Admitting: Surgery

## 2023-07-09 ENCOUNTER — Ambulatory Visit (HOSPITAL_COMMUNITY): Payer: 59 | Admitting: Cardiovascular Disease

## 2023-07-09 ENCOUNTER — Encounter: Payer: 59 | Admitting: Surgery

## 2023-07-09 ENCOUNTER — Ambulatory Visit (HOSPITAL_COMMUNITY)
Admission: RE | Admit: 2023-07-09 | Discharge: 2023-07-09 | Disposition: A | Discharge status: Home / Self Care | Payer: 59 | Source: Ambulatory Visit | Attending: Surgery | Admitting: Surgery

## 2023-07-09 DIAGNOSIS — Z87891 Personal history of nicotine dependence: Secondary | ICD-10-CM | POA: Diagnosis present

## 2023-07-09 DIAGNOSIS — I739 Peripheral vascular disease, unspecified: Secondary | ICD-10-CM

## 2023-07-09 LAB — VAS US ABI WITH/WO TBI
Left ABI: 1.19
Right ABI: 1.16

## 2023-07-12 ENCOUNTER — Ambulatory Visit (HOSPITAL_COMMUNITY): Payer: 59

## 2023-07-14 ENCOUNTER — Ambulatory Visit (HOSPITAL_COMMUNITY)
Admission: RE | Admit: 2023-07-14 | Discharge: 2023-07-14 | Disposition: A | Discharge status: Home / Self Care | Payer: 59 | Source: Ambulatory Visit | Attending: Surgery | Admitting: Surgery

## 2023-07-14 DIAGNOSIS — I7143 Infrarenal abdominal aortic aneurysm, without rupture: Secondary | ICD-10-CM | POA: Diagnosis present

## 2023-07-14 MED ORDER — IOHEXOL 350 MG/ML SOLN
100.0000 mL | Freq: Once | INTRAVENOUS | Status: AC | PRN
  Administered 2023-07-14: 100 mL via INTRAVENOUS

## 2023-07-16 ENCOUNTER — Ambulatory Visit (INDEPENDENT_AMBULATORY_CARE_PROVIDER_SITE_OTHER): Payer: 59 | Admitting: Surgery

## 2023-07-16 ENCOUNTER — Encounter: Payer: Self-pay | Admitting: Surgery

## 2023-07-16 VITALS — BP 134/92 | HR 85 | Temp 97.7°F | Resp 20 | Ht 76.0 in | Wt 248.0 lb

## 2023-07-16 DIAGNOSIS — I7143 Infrarenal abdominal aortic aneurysm, without rupture: Secondary | ICD-10-CM | POA: Diagnosis not present

## 2023-07-16 NOTE — H&P (View-Only) (Signed)
 Vascular and Vein Specialist of [place]  Patient name: Rodney Williams MRN: [id number] DOB: 1961-06-03 Sex: male   REASON FOR VISIT:    Follow up  HISOTRY OF PRESENT ILLNESS:    Rodney Williams is a 63 y.o. male who returns today for follow-up of a abdominal aortic aneurysm.  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  I sent her for CT scan.  He is back for follow-up  He is a current smoker. He takes a statin for hypercholesterolemia. He is on inhalers for COPD from tobacco abuse. He is medically managed for hypertension  PAST MEDICAL HISTORY:   Past Medical History:  Diagnosis Date   Complication of anesthesia    woke up during surgery   COPD (chronic obstructive pulmonary disease) (HCC)    Hyperlipidemia    Olecranon bursitis of right elbow    Tobacco abuse    smoke for 40 years     FAMILY HISTORY:   Family History  Problem Relation Age of Onset   Diabetes Father    Alcohol abuse Neg Hx    Cancer Neg Hx    Early death Neg Hx    Heart disease Neg Hx    Hyperlipidemia Neg Hx    Hypertension Neg Hx    Stroke Neg Hx     SOCIAL HISTORY:   Social History   Tobacco Use   Smoking status: Former    Current packs/day: 1.50    Average packs/day: 1.5 packs/day for 40.0 years (60.0 ttl pk-yrs)    Types: Cigarettes   Smokeless tobacco: Never  Substance Use Topics   Alcohol use: No     ALLERGIES:   No Known Allergies   CURRENT MEDICATIONS:   Current Outpatient Medications  Medication Sig Dispense Refill   albuterol  (VENTOLIN  HFA) 108 (90 Base) MCG/ACT inhaler INHALE 2 PUFFS INTO LUNGS EVERY 6 HOURS AS NEEDED FOR SHORTNESS OF BREATH/WHEEZING 18 each 2   ANORO ELLIPTA  62.5-25 MCG/ACT AEPB INHALE 1 PUFF INTO THE LUNGS DAILY AT 6 (SIX) AM. 60 each 1   aspirin  EC 81 MG tablet Take 1 tablet (81 mg total) by mouth daily. Swallow whole. 90 tablet 1   atorvastatin  (LIPITOR) 40 MG  tablet Take 1 tablet (40 mg total) by mouth daily. 90 tablet 3   icosapent  Ethyl (VASCEPA ) 1 g capsule Take 2 capsules (2 g total) by mouth 2 (two) times daily. 120 capsule 1   metoprolol  tartrate (LOPRESSOR ) 100 MG tablet Take 1 tablet (100 mg total) by mouth 2 (two) times daily. (Patient not taking: Reported on 07/16/2023) 180 tablet 3   olmesartan  (BENICAR ) 20 MG tablet Take 1 tablet (20 mg total) by mouth daily. (Patient not taking: Reported on 07/16/2023) 90 tablet 0   No current facility-administered medications for this visit.    REVIEW OF SYSTEMS:   [X]  denotes positive finding, [ ]  denotes negative finding Cardiac  Comments:  Chest pain or chest pressure:    Shortness of breath upon exertion:    Short of breath when lying flat:    Irregular heart rhythm:        Vascular    Pain in calf, thigh, or hip brought on by ambulation:    Pain in feet at night that wakes you up from your sleep:     Blood clot in your veins:    Leg swelling:         Pulmonary  Oxygen at home:    Productive cough:     Wheezing:         Neurologic    Sudden weakness in arms or legs:     Sudden numbness in arms or legs:     Sudden onset of difficulty speaking or slurred speech:    Temporary loss of vision in one eye:     Problems with dizziness:         Gastrointestinal    Blood in stool:     Vomited blood:         Genitourinary    Burning when urinating:     Blood in urine:        Psychiatric    Major depression:         Hematologic    Bleeding problems:    Problems with blood clotting too easily:        Skin    Rashes or ulcers:        Constitutional    Fever or chills:      PHYSICAL EXAM:   Vitals:   07/16/23 1053  BP: (!) 134/92  Pulse: 85  Resp: 20  Temp: 97.7 F (36.5 C)  SpO2: 93%  Weight: 248 lb (112.5 kg)  Height: 6' 4 (1.93 m)    GENERAL: The patient is a well-nourished male, in no acute distress. The vital signs are documented above. CARDIAC: There is a  regular rate and rhythm.  PULMONARY: Non-labored respirations ABDOMEN: Soft and non-tender .  MUSCULOSKELETAL: There are no major deformities or cyanosis. NEUROLOGIC: No focal weakness or paresthesias are detected. SKIN: There are no ulcers or rashes noted. PSYCHIATRIC: The patient has a normal affect.  STUDIES:   I have reviewed the following: 1. Infrarenal abdominal aortic aneurysm measuring 5.4 x 5.1 cm. Recommend follow-up CT or MR as appropriate in 6 months and referral to or continued care with vascular specialist. (Ref.: J Vasc Surg. 2018; 67:2-77 and J Am Coll Radiol 2013;10(10):789-794.) 2. 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 3. Diffuse hepatic steatosis.   Carotid: Right Carotid: The extracranial vessels were near-normal with only minimal  wall                thickening or plaque.   Left Carotid: The extracranial vessels were near-normal with only minimal  wall               thickening or plaque.   Vertebrals:  Bilateral vertebral arteries demonstrate antegrade flow.  Subclavians: Normal flow hemodynamics were seen in bilateral subclavian               arteries.   MEDICAL ISSUES:   AAA: Maximum diameter is 5 4 cm of his abdominal aorta.  Right common iliac artery measures 4.6 cm.  I discussed proceeding with a sedation of the right hypogastric artery.  I talked about the risks and benefits of surgery including the risk of intestinal complications, renal complications, stroke, death, lower extremity arterial issues and wound complications.  We also talked about buttock claudication .to proceed but is willing to move forward.  We discussed that the alternative potentially bursting and likely resulting in death.  He has been near future    [name], IV, MD, FACS Vascular and Vein Specialists of [facility] [phone] Pager [phone]

## 2023-07-16 NOTE — Progress Notes (Addendum)
 [facility] [place]  Patient name: Rodney Williams MRN: [id number] DOB: 1961-06-03 Sex: male   REASON FOR VISIT:    Follow up  HISOTRY OF PRESENT ILLNESS:    Rodney Williams is a 63 y.o. male who returns today for follow-up of a abdominal aortic aneurysm.  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  I sent her for CT scan.  He is back for follow-up  He is a current smoker. He takes a statin for hypercholesterolemia. He is on inhalers for COPD from tobacco abuse. He is medically managed for hypertension  PAST MEDICAL HISTORY:   Past Medical History:  Diagnosis Date   Complication of anesthesia    woke up during surgery   COPD (chronic obstructive pulmonary disease) (HCC)    Hyperlipidemia    Olecranon bursitis of right elbow    Tobacco abuse    smoke for 40 years     FAMILY HISTORY:   Family History  Problem Relation Age of Onset   Diabetes Father    Alcohol abuse Neg Hx    Cancer Neg Hx    Early death Neg Hx    Heart disease Neg Hx    Hyperlipidemia Neg Hx    Hypertension Neg Hx    Stroke Neg Hx     SOCIAL HISTORY:   Social History   Tobacco Use   Smoking status: Former    Current packs/day: 1.50    Average packs/day: 1.5 packs/day for 40.0 years (60.0 ttl pk-yrs)    Types: Cigarettes   Smokeless tobacco: Never  Substance Use Topics   Alcohol use: No     ALLERGIES:   No Known Allergies   CURRENT MEDICATIONS:   Current Outpatient Medications  Medication Sig Dispense Refill   albuterol  (VENTOLIN  HFA) 108 (90 Base) MCG/ACT inhaler INHALE 2 PUFFS INTO LUNGS EVERY 6 HOURS AS NEEDED FOR SHORTNESS OF BREATH/WHEEZING 18 each 2   ANORO ELLIPTA  62.5-25 MCG/ACT AEPB INHALE 1 PUFF INTO THE LUNGS DAILY AT 6 (SIX) AM. 60 each 1   aspirin  EC 81 MG tablet Take 1 tablet (81 mg total) by mouth daily. Swallow whole. 90 tablet 1   atorvastatin  (LIPITOR) 40 MG  tablet Take 1 tablet (40 mg total) by mouth daily. 90 tablet 3   icosapent  Ethyl (VASCEPA ) 1 g capsule Take 2 capsules (2 g total) by mouth 2 (two) times daily. 120 capsule 1   metoprolol  tartrate (LOPRESSOR ) 100 MG tablet Take 1 tablet (100 mg total) by mouth 2 (two) times daily. (Patient not taking: Reported on 07/16/2023) 180 tablet 3   olmesartan  (BENICAR ) 20 MG tablet Take 1 tablet (20 mg total) by mouth daily. (Patient not taking: Reported on 07/16/2023) 90 tablet 0   No current facility-administered medications for this visit.    REVIEW OF SYSTEMS:   [X]  denotes positive finding, [ ]  denotes negative finding Cardiac  Comments:  Chest pain or chest pressure:    Shortness of breath upon exertion:    Short of breath when lying flat:    Irregular heart rhythm:        Vascular    Pain in calf, thigh, or hip brought on by ambulation:    Pain in feet at night that wakes you up from your sleep:     Blood clot in your veins:    Leg swelling:         Pulmonary  Oxygen at home:    Productive cough:     Wheezing:         Neurologic    Sudden weakness in arms or legs:     Sudden numbness in arms or legs:     Sudden onset of difficulty speaking or slurred speech:    Temporary loss of vision in one eye:     Problems with dizziness:         Gastrointestinal    Blood in stool:     Vomited blood:         Genitourinary    Burning when urinating:     Blood in urine:        Psychiatric    Major depression:         Hematologic    Bleeding problems:    Problems with blood clotting too easily:        Skin    Rashes or ulcers:        Constitutional    Fever or chills:      PHYSICAL EXAM:   Vitals:   07/16/23 1053  BP: (!) 134/92  Pulse: 85  Resp: 20  Temp: 97.7 F (36.5 C)  SpO2: 93%  Weight: 248 lb (112.5 kg)  Height: 6' 4 (1.93 m)    GENERAL: The patient is a well-nourished male, in no acute distress. The vital signs are documented above. CARDIAC: There is a  regular rate and rhythm.  PULMONARY: Non-labored respirations ABDOMEN: Soft and non-tender .  MUSCULOSKELETAL: There are no major deformities or cyanosis. NEUROLOGIC: No focal weakness or paresthesias are detected. SKIN: There are no ulcers or rashes noted. PSYCHIATRIC: The patient has a normal affect.  STUDIES:   I have reviewed the following: 1. Infrarenal abdominal aortic aneurysm measuring 5.4 x 5.1 cm. Recommend follow-up CT or MR as appropriate in 6 months and referral to or continued care with vascular specialist. (Ref.: J Vasc Surg. 2018; 67:2-77 and J Am Coll Radiol 2013;10(10):789-794.) 2. 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 3. Diffuse hepatic steatosis.   Carotid: Right Carotid: The extracranial vessels were near-normal with only minimal  wall                thickening or plaque.   Left Carotid: The extracranial vessels were near-normal with only minimal  wall               thickening or plaque.   Vertebrals:  Bilateral vertebral arteries demonstrate antegrade flow.  Subclavians: Normal flow hemodynamics were seen in bilateral subclavian               arteries.   MEDICAL ISSUES:   AAA: Maximum diameter is 5 4 cm of his abdominal aorta.  Right common iliac artery measures 4.6 cm.  I discussed proceeding with a sedation of the right hypogastric artery.  I talked about the risks and benefits of surgery including the risk of intestinal complications, renal complications, stroke, death, lower extremity arterial issues and wound complications.  We also talked about buttock claudication .to proceed but is willing to move forward.  We discussed that the alternative potentially bursting and likely resulting in death.  He has been near future    [name], IV, MD, FACS [facility] [phone] Pager [phone]

## 2023-07-21 ENCOUNTER — Other Ambulatory Visit: Payer: Self-pay | Admitting: Internal Medicine

## 2023-07-21 DIAGNOSIS — J4489 Other specified chronic obstructive pulmonary disease: Secondary | ICD-10-CM

## 2023-07-23 ENCOUNTER — Other Ambulatory Visit: Payer: Self-pay

## 2023-07-23 DIAGNOSIS — I7143 Infrarenal abdominal aortic aneurysm, without rupture: Secondary | ICD-10-CM

## 2023-08-01 ENCOUNTER — Encounter (HOSPITAL_COMMUNITY): Payer: Self-pay

## 2023-08-01 NOTE — Pre-Procedure Instructions (Signed)
Surgical Instructions   Your procedure is scheduled on Wednesday, February 5th. Report to [facility] Main Entrance "A" at 09:00 A.M., then check in with the Admitting office. Any questions or running late day of surgery: call [phone]  Questions prior to your surgery date: call [phone], Monday-Friday, 8am-4pm. If you experience any cold or flu symptoms such as cough, fever, chills, shortness of breath, etc. between now and your scheduled surgery, please notify us at the above number.     Remember:  Do not eat or drink after midnight the night before your surgery    Take these medicines the morning of surgery with A SIP OF WATER  ANORO ELLIPTA  atorvastatin (LIPITOR)   May take these medicines IF NEEDED: albuterol (VENTOLIN HFA)- bring inhaler with you on day of surgery   One week prior to surgery, STOP taking any Aspirin (unless otherwise instructed by your surgeon) Aleve, Naproxen, Ibuprofen, Motrin, Advil, Goody's, BC's, all herbal medications, fish oil, and non-prescription vitamins.                     Do NOT Smoke (Tobacco/Vaping) for 24 hours prior to your procedure.  If you use a CPAP at night, you may bring your mask/headgear for your overnight stay.   You will be asked to remove any contacts, glasses, piercing's, hearing aid's, dentures/partials prior to surgery. Please bring cases for these items if needed.    Patients discharged the day of surgery will not be allowed to drive home, and someone needs to stay with them for 24 hours.  SURGICAL WAITING ROOM VISITATION Patients may have no more than 2 support people in the waiting area - these visitors may rotate.   Pre-op nurse will coordinate an appropriate time for 1 ADULT support person, who may not rotate, to accompany patient in pre-op.  Children under the age of 31 must have an adult with them who is not the patient and must remain in the main waiting area with an adult.  If the patient needs to stay at  the hospital during part of their recovery, the visitor guidelines for inpatient rooms apply.  Please refer to the [facility] website for the visitor guidelines for any additional information.   If you received a COVID test during your pre-op visit  it is requested that you wear a mask when out in public, stay away from anyone that may not be feeling well and notify your surgeon if you develop symptoms. If you have been in contact with anyone that has tested positive in the last 10 days please notify you surgeon.      Pre-operative CHG Bathing Instructions   You can play a key role in reducing the risk of infection after surgery. Your skin needs to be as free of germs as possible. You can reduce the number of germs on your skin by washing with CHG (chlorhexidine gluconate) soap before surgery. CHG is an antiseptic soap that kills germs and continues to kill germs even after washing.   DO NOT use if you have an allergy to chlorhexidine/CHG or antibacterial soaps. If your skin becomes reddened or irritated, stop using the CHG and notify one of our RNs at [phone].              TAKE A SHOWER THE NIGHT BEFORE SURGERY AND THE DAY OF SURGERY    Please keep in mind the following:  DO NOT shave, including legs and underarms, 48 hours prior to surgery.  You may shave your face before/day of surgery.  Place clean sheets on your bed the night before surgery Use a clean washcloth (not used since being washed) for each shower. DO NOT sleep with pet's night before surgery.  CHG Shower Instructions:  Wash your face and private area with normal soap. If you choose to wash your hair, wash first with your normal shampoo.  After you use shampoo/soap, rinse your hair and body thoroughly to remove shampoo/soap residue.  Turn the water OFF and apply half the bottle of CHG soap to a CLEAN washcloth.  Apply CHG soap ONLY FROM YOUR NECK DOWN TO YOUR TOES (washing for 3-5 minutes)  DO NOT use CHG soap on  face, private areas, open wounds, or sores.  Pay special attention to the area where your surgery is being performed.  If you are having back surgery, having someone wash your back for you may be helpful. Wait 2 minutes after CHG soap is applied, then you may rinse off the CHG soap.  Pat dry with a clean towel  Put on clean pajamas    Additional instructions for the day of surgery: DO NOT APPLY any lotions, deodorants, cologne, or perfumes.   Do not wear jewelry or makeup Do not wear nail polish, gel polish, artificial nails, or any other type of covering on natural nails (fingers and toes) Do not bring valuables to the hospital. [facility] is not responsible for valuables/personal belongings. Put on clean/comfortable clothes.  Please brush your teeth.  Ask your nurse before applying any prescription medications to the skin.

## 2023-08-02 ENCOUNTER — Encounter (HOSPITAL_COMMUNITY)
Admission: RE | Admit: 2023-08-02 | Discharge: 2023-08-02 | Disposition: A | Discharge status: Home / Self Care | Payer: 59 | Source: Ambulatory Visit | Attending: Surgery | Admitting: Surgery

## 2023-08-02 ENCOUNTER — Other Ambulatory Visit: Payer: Self-pay

## 2023-08-02 ENCOUNTER — Encounter (HOSPITAL_COMMUNITY): Payer: Self-pay

## 2023-08-02 VITALS — BP 139/86 | HR 92 | Temp 98.0°F | Resp 18 | Ht 76.0 in | Wt 244.1 lb

## 2023-08-02 DIAGNOSIS — I7143 Infrarenal abdominal aortic aneurysm, without rupture: Secondary | ICD-10-CM | POA: Diagnosis not present

## 2023-08-02 DIAGNOSIS — Z01818 Encounter for other preprocedural examination: Secondary | ICD-10-CM

## 2023-08-02 DIAGNOSIS — Z01812 Encounter for preprocedural laboratory examination: Secondary | ICD-10-CM | POA: Insufficient documentation

## 2023-08-02 DIAGNOSIS — E785 Hyperlipidemia, unspecified: Secondary | ICD-10-CM | POA: Insufficient documentation

## 2023-08-02 DIAGNOSIS — I10 Essential (primary) hypertension: Secondary | ICD-10-CM | POA: Diagnosis not present

## 2023-08-02 DIAGNOSIS — J439 Emphysema, unspecified: Secondary | ICD-10-CM | POA: Diagnosis not present

## 2023-08-02 DIAGNOSIS — Z87891 Personal history of nicotine dependence: Secondary | ICD-10-CM | POA: Diagnosis not present

## 2023-08-02 HISTORY — DX: Aneurysm of iliac artery: I72.3

## 2023-08-02 HISTORY — DX: Essential (primary) hypertension: I10

## 2023-08-02 HISTORY — DX: Unspecified osteoarthritis, unspecified site: M19.90

## 2023-08-02 LAB — URINALYSIS, ROUTINE W REFLEX MICROSCOPIC
Bacteria, UA: NONE SEEN
Bilirubin Urine: NEGATIVE
Glucose, UA: NEGATIVE mg/dL
Ketones, ur: NEGATIVE mg/dL
Leukocytes,Ua: NEGATIVE
Nitrite: NEGATIVE
Protein, ur: NEGATIVE mg/dL
Specific Gravity, Urine: 1.019 (ref 1.005–1.030)
pH: 5 (ref 5.0–8.0)

## 2023-08-02 LAB — PROTIME-INR
INR: 0.9 (ref 0.8–1.2)
Prothrombin Time: 12.6 s (ref 11.4–15.2)

## 2023-08-02 LAB — SURGICAL PCR SCREEN
MRSA, PCR: NEGATIVE
Staphylococcus aureus: NEGATIVE

## 2023-08-02 LAB — COMPREHENSIVE METABOLIC PANEL
ALT: 32 U/L (ref 0–44)
AST: 22 U/L (ref 15–41)
Albumin: 4.1 g/dL (ref 3.5–5.0)
Alkaline Phosphatase: 60 U/L (ref 38–126)
Anion gap: 7 (ref 5–15)
BUN: 17 mg/dL (ref 8–23)
CO2: 31 mmol/L (ref 22–32)
Calcium: 9.6 mg/dL (ref 8.9–10.3)
Chloride: 100 mmol/L (ref 98–111)
Creatinine, Ser: 1.5 mg/dL — ABNORMAL HIGH (ref 0.61–1.24)
GFR, Estimated: 53 mL/min — ABNORMAL LOW (ref >60)
Glucose, Bld: 94 mg/dL (ref 70–99)
Potassium: 4.7 mmol/L (ref 3.5–5.1)
Sodium: 138 mmol/L (ref 135–145)
Total Bilirubin: 0.9 mg/dL (ref 0.0–1.2)
Total Protein: 7.3 g/dL (ref 6.5–8.1)

## 2023-08-02 LAB — CBC
HCT: 54.9 % — ABNORMAL HIGH (ref 39.0–52.0)
Hemoglobin: 18.3 g/dL — ABNORMAL HIGH (ref 13.0–17.0)
MCH: 29.6 pg (ref 26.0–34.0)
MCHC: 33.3 g/dL (ref 30.0–36.0)
MCV: 88.8 fL (ref 80.0–100.0)
Platelets: 184 10*3/uL (ref 150–400)
RBC: 6.18 MIL/uL — ABNORMAL HIGH (ref 4.22–5.81)
RDW: 13.7 % (ref 11.5–15.5)
WBC: 5.7 10*3/uL (ref 4.0–10.5)
nRBC: 0 % (ref 0.0–0.2)

## 2023-08-02 LAB — APTT: aPTT: 29 s (ref 24–36)

## 2023-08-02 NOTE — Progress Notes (Signed)
PCP - Dr. Sanda Linger Cardiologist - Dr. Nicki Guadalajara  PPM/ICD - denies   Chest x-ray - 10/13/21 EKG - 04/26/23 Stress Test - denies ECHO - 05/25/23 Cardiac Cath - denies  Sleep Study - denies   DM- denies  Last dose of GLP1 agonist-  n/a   ASA/Blood Thinner Instructions: n/a   ERAS Protcol - no, NPO   COVID TEST- n/a   Anesthesia review: yes, cardiac hx  Patient denies shortness of breath, fever, cough and chest pain at PAT appointment   All instructions explained to the patient, with a verbal understanding of the material. Patient agrees to go over the instructions while at home for a better understanding. The opportunity to ask questions was provided.

## 2023-08-03 NOTE — Anesthesia Preprocedure Evaluation (Signed)
Anesthesia Evaluation    Airway        Dental   Pulmonary former smoker          Cardiovascular hypertension,      Neuro/Psych    GI/Hepatic   Endo/Other    Renal/GU      Musculoskeletal   Abdominal   Peds  Hematology   Anesthesia Other Findings   Reproductive/Obstetrics                              Anesthesia Physical Anesthesia Plan  ASA:   Anesthesia Plan:    Post-op Pain Management:    Induction:   PONV Risk Score and Plan:   Airway Management Planned:   Additional Equipment:   Intra-op Plan:   Post-operative Plan:   Informed Consent:   Plan Discussed with:   Anesthesia Plan Comments: (PAT note by Antionette Poles, PA-C: 63 year old male follows with cardiology for history of HTN, HLD, and AAA.  Last seen by Dr. Tresa Endo on 04/26/2023.  No acute concerns at that time.  Echocardiogram and abdominal aortoiliac duplex scan were ordered.  Echo 05/25/2023 showed EF 60 to 65%, grade 1 DD, no significant valvular abnormalities.  AAA duplex showed significant increase in size; patient was referred to vascular surgery.  Patient subsequently seen by Dr. Myra Gianotti.  CTA showed maximum diameter of infrarenal AAA 5.4 cm, and right common iliac artery aneurysm 4.6 cm.  Endovascular intervention recommended.  Former smoker (60 pack years, quit 04/2022), with associated COPD/emphysema.  Maintained on an oral Ellipta.  Preop labs reviewed, creatinine mildly elevated 1.50, otherwise unremarkable.  EKG 04/26/2023: NSR.  Rate 99.  TTE 05/25/2023:  1. Left ventricular ejection fraction, by estimation, is 60 to 65%. The  left ventricle has normal function. The left ventricle has no regional  wall motion abnormalities. There is mild left ventricular hypertrophy.  Left ventricular diastolic parameters  are consistent with Grade I diastolic dysfunction (impaired relaxation).   2. Right ventricular  systolic function is normal. The right ventricular  size is normal.   3. The mitral valve is normal in structure. No evidence of mitral valve  regurgitation. No evidence of mitral stenosis.   4. The aortic valve is tricuspid. Aortic valve regurgitation is not  visualized. No aortic stenosis is present.   5. Aortic dilatation noted. There is mild dilatation of the aortic root,  measuring 41 mm.   6. The inferior vena cava is normal in size with greater than 50%  respiratory variability, suggesting right atrial pressure of 3 mmHg.   )         Anesthesia Quick Evaluation

## 2023-08-03 NOTE — Progress Notes (Signed)
Anesthesia Chart Review:  63 year old male follows with cardiology for history of HTN, HLD, and AAA.  Last seen by Dr. Tresa Endo on 04/26/2023.  No acute concerns at that time.  Echocardiogram and abdominal aortoiliac duplex scan were ordered.  Echo 05/25/2023 showed EF 60 to 65%, grade 1 DD, no significant valvular abnormalities.  AAA duplex showed significant increase in size; patient was referred to vascular surgery.  Patient subsequently seen by Dr. Myra Gianotti.  CTA showed maximum diameter of infrarenal AAA 5.4 cm, and right common iliac artery aneurysm 4.6 cm.  Endovascular intervention recommended.  Former smoker (60 pack years, quit 04/2022), with associated COPD/emphysema.  Maintained on an oral Ellipta.  Preop labs reviewed, creatinine mildly elevated 1.50, otherwise unremarkable.  EKG 04/26/2023: NSR.  Rate 99.  TTE 05/25/2023:  1. Left ventricular ejection fraction, by estimation, is 60 to 65%. The  left ventricle has normal function. The left ventricle has no regional  wall motion abnormalities. There is mild left ventricular hypertrophy.  Left ventricular diastolic parameters  are consistent with Grade I diastolic dysfunction (impaired relaxation).   2. Right ventricular systolic function is normal. The right ventricular  size is normal.   3. The mitral valve is normal in structure. No evidence of mitral valve  regurgitation. No evidence of mitral stenosis.   4. The aortic valve is tricuspid. Aortic valve regurgitation is not  visualized. No aortic stenosis is present.   5. Aortic dilatation noted. There is mild dilatation of the aortic root,  measuring 41 mm.   6. The inferior vena cava is normal in size with greater than 50%  respiratory variability, suggesting right atrial pressure of 3 mmHg.     Zannie Cove [facility] Short Stay Center/Anesthesiology Phone [phone] 08/03/2023 1:14 PM

## 2023-08-06 ENCOUNTER — Ambulatory Visit (INDEPENDENT_AMBULATORY_CARE_PROVIDER_SITE_OTHER)
Admission: RE | Admit: 2023-08-06 | Discharge: 2023-08-06 | Disposition: A | Discharge status: Home / Self Care | Payer: 59 | Source: Ambulatory Visit | Attending: Internal Medicine | Admitting: Internal Medicine

## 2023-08-06 ENCOUNTER — Ambulatory Visit (INDEPENDENT_AMBULATORY_CARE_PROVIDER_SITE_OTHER): Payer: 59 | Admitting: Internal Medicine

## 2023-08-06 ENCOUNTER — Encounter: Payer: Self-pay | Admitting: Internal Medicine

## 2023-08-06 VITALS — BP 138/88 | HR 84 | Temp 98.2°F | Resp 16 | Ht 76.0 in | Wt 245.0 lb

## 2023-08-06 DIAGNOSIS — I739 Peripheral vascular disease, unspecified: Secondary | ICD-10-CM

## 2023-08-06 DIAGNOSIS — J4489 Other specified chronic obstructive pulmonary disease: Secondary | ICD-10-CM

## 2023-08-06 DIAGNOSIS — G252 Other specified forms of tremor: Secondary | ICD-10-CM | POA: Diagnosis not present

## 2023-08-06 DIAGNOSIS — R058 Other specified cough: Secondary | ICD-10-CM

## 2023-08-06 DIAGNOSIS — I10 Essential (primary) hypertension: Secondary | ICD-10-CM | POA: Diagnosis not present

## 2023-08-06 DIAGNOSIS — R7989 Other specified abnormal findings of blood chemistry: Secondary | ICD-10-CM

## 2023-08-06 NOTE — Patient Instructions (Signed)
 Tremor A tremor is trembling or shaking that you cannot control. Most tremors affect the hands or arms. Tremors can also affect the head, vocal cords, face, and other parts of the body. There are many types of tremors. Common types include: Essential tremor. These usually occur in people older than 40. This type of tremor may run in families and can happen in otherwise healthy people. Resting tremor. These occur when the muscles are at rest, such as when your hands are resting in your lap. People with Parkinson's disease often have resting tremors. Postural tremor. These occur when you try to hold a pose, such as keeping your hands outstretched. Kinetic tremor. These occur during purposeful movement, such as trying to touch a finger to your nose. Task-specific tremor. These may occur when you do certain tasks such as writing, speaking, or standing. Psychogenic tremor. These are greatly reduced or go away when you are distracted. These tremors happen due to underlying stress or psychiatric disease. They can happen in people of all ages. Some types of tremors have no known cause. Tremors can also be a symptom of nervous system problems (neurological disorders) that may occur with aging. Some tremors go away with treatment, while others do not. Follow these instructions at home: Lifestyle     If you drink alcohol: Limit how much you have to: 0-1 drink a day for women who are not pregnant. 0-2 drinks a day for men. Know how much alcohol is in a drink. In the [place] one drink equals one 12 oz bottle of beer (355 mL), one 5 oz glass of wine (148 mL), or one 1 oz glass of hard liquor (44 mL). Do not use any products that contain nicotine or tobacco. These products include cigarettes, chewing tobacco, and vaping devices, such as e-cigarettes. If you need help quitting, ask your health care provider. Avoid extreme heat and extreme cold. Limit your caffeine intake, as told by your health care  provider. Try to get 8 hours of sleep each night. Find ways to manage your stress, such as meditation or yoga. General instructions Take over-the-counter and prescription medicines only as told by your health care provider. Keep all follow-up visits. This is important. Contact a health care provider if: You develop a tremor after starting a new medicine. You have a tremor along with other symptoms such as: Numbness. Tingling. Pain. Weakness. Your tremor gets worse. Your tremor interferes with your day-to-day life. Summary A tremor is trembling or shaking that you cannot control. Most tremors affect the hands or arms. Some types of tremors have no known cause. Others may be a symptom of nervous system problems (neurological disorders). Make sure you discuss any tremors you have with your health care provider. This information is not intended to replace advice given to you by your health care provider. Make sure you discuss any questions you have with your health care provider. Document Revised: 04/08/2021 Document Reviewed: 04/08/2021 Elsevier Patient Education  2024 [employer].

## 2023-08-06 NOTE — Progress Notes (Unsigned)
Subjective:  Patient ID: [name], male    DOB: January 01, 1961  Age: 63 y.o. MRN: [id number]  CC: Hypertension, COPD, Cough, and Hyperlipidemia   HPI Rodney Williams presents for f/up ----  Discussed the use of AI scribe software for clinical note transcription with the patient, who gave verbal consent to proceed.  History of Present Illness   The patient presents with increasing tremors and hernia pain. He was referred by his vascular surgeon for evaluation of tremors.  He has been experiencing tremors for a little over a month, possibly two months, affecting his entire body with noticeable shaking in his hands and legs. The tremors are evident when holding objects, such as a steering wheel. He stopped taking his medications for a month to see if it would alleviate the tremors, but there was no change. He has not previously consulted a neurologist regarding this issue.  He is experiencing pain in the area of his belly button, which he attributes to a hernia. He has discussed this with his surgeon, who indicated that it could be fixed. He is scheduled to have double aneurysms repaired on Wednesday.  He has a history of COPD and reports occasional breathing difficulties. He is currently using Anoro, but finds it less effective than Trelegy, which his insurance does not cover. He experiences a cough that produces phlegm, particularly in the morning. The phlegm is described as darker and yellowish, which is not new for him. He has not smoked since October 2023.       Outpatient Medications Prior to Visit  Medication Sig Dispense Refill   albuterol (VENTOLIN HFA) 108 (90 Base) MCG/ACT inhaler INHALE 2 PUFFS INTO LUNGS EVERY 6 HOURS AS NEEDED FOR SHORTNESS OF BREATH/WHEEZING 18 each 2   aspirin EC 81 MG tablet Take 1 tablet (81 mg total) by mouth daily. Swallow whole. 90 tablet 1   olmesartan (BENICAR) 20 MG tablet Take 1 tablet (20 mg total) by mouth daily. 90 tablet 0   ANORO ELLIPTA  62.5-25 MCG/ACT AEPB INHALE 1 PUFF INTO THE LUNGS DAILY AT 6 (SIX) AM. 60 each 1   atorvastatin (LIPITOR) 40 MG tablet Take 1 tablet (40 mg total) by mouth daily. 90 tablet 3   icosapent Ethyl (VASCEPA) 1 g capsule Take 2 capsules (2 g total) by mouth 2 (two) times daily. (Patient not taking: Reported on 08/06/2023) 120 capsule 1   metoprolol tartrate (LOPRESSOR) 100 MG tablet Take 1 tablet (100 mg total) by mouth 2 (two) times daily. (Patient not taking: Reported on 06/21/2023) 180 tablet 3   No facility-administered medications prior to visit.    ROS Review of Systems  Constitutional:  Negative for appetite change, diaphoresis, fatigue and unexpected weight change.  HENT: Negative.  Negative for sore throat.   Eyes: Negative.   Respiratory:  Positive for cough and shortness of breath. Negative for chest tightness and wheezing.   Cardiovascular:  Negative for chest pain, palpitations and leg swelling.  Gastrointestinal: Negative.  Negative for abdominal pain, constipation, diarrhea, nausea and vomiting.  Endocrine: Negative.   Genitourinary: Negative.  Negative for difficulty urinating.  Musculoskeletal: Negative.  Negative for arthralgias and myalgias.  Skin: Negative.   Neurological: Negative.  Negative for dizziness and weakness.  Hematological:  Negative for adenopathy. Does not bruise/bleed easily.  Psychiatric/Behavioral: Negative.      Objective:  BP 138/88 (BP Location: Left Arm, Patient Position: Sitting, Cuff Size: Normal)   Pulse 84   Temp 98.2 F (36.8 C) (  Oral)   Resp 16   Ht 6\' 4"  (1.93 m)   Wt 245 lb (111.1 kg)   SpO2 94%   BMI 29.82 kg/m   BP Readings from Last 3 Encounters:  08/06/23 138/88  08/02/23 139/86  07/16/23 (!) 134/92    Wt Readings from Last 3 Encounters:  08/06/23 245 lb (111.1 kg)  08/02/23 244 lb 1.6 oz (110.7 kg)  07/16/23 248 lb (112.5 kg)    Physical Exam Vitals reviewed.  Constitutional:      General: He is not in acute distress.     Appearance: Normal appearance. He is not ill-appearing, toxic-appearing or diaphoretic.  HENT:     Mouth/Throat:     Mouth: Mucous membranes are moist.  Eyes:     General: No scleral icterus.    Conjunctiva/sclera: Conjunctivae normal.  Cardiovascular:     Rate and Rhythm: Normal rate and regular rhythm.     Pulses: Normal pulses.     Heart sounds: No murmur heard.    No friction rub. No gallop.  Pulmonary:     Effort: Pulmonary effort is normal.     Breath sounds: No stridor. No wheezing, rhonchi or rales.  Abdominal:     General: Abdomen is flat.     Tenderness: There is no abdominal tenderness.     Hernia: A hernia is present. Hernia is present in the umbilical area. There is no hernia in the ventral area, left inguinal area, right femoral area, left femoral area or right inguinal area.     Comments: Small umbilical hernia  Musculoskeletal:        General: Normal range of motion.     Cervical back: Neck supple.     Right lower leg: No edema.     Left lower leg: No edema.  Lymphadenopathy:     Cervical: No cervical adenopathy.  Skin:    General: Skin is warm and dry.  Neurological:     General: No focal deficit present.     Mental Status: He is alert.  Psychiatric:        Mood and Affect: Mood normal.        Behavior: Behavior normal.     Lab Results  Component Value Date   WBC 5.7 08/02/2023   HGB 18.3 (H) 08/02/2023   HCT 54.9 (H) 08/02/2023   PLT 184 08/02/2023   GLUCOSE 94 08/02/2023   CHOL 150 04/30/2023   TRIG 194 (H) 04/30/2023   HDL 32 (L) 04/30/2023   LDLDIRECT 128.0 02/22/2023   LDLCALC 85 04/30/2023   ALT 32 08/02/2023   AST 22 08/02/2023   NA 138 08/02/2023   K 4.7 08/02/2023   CL 100 08/02/2023   CREATININE 1.50 (H) 08/02/2023   BUN 17 08/02/2023   CO2 31 08/02/2023   TSH 4.53 (H) 08/06/2023   PSA 1.17 02/22/2023   INR 0.9 08/02/2023   HGBA1C 5.8 02/22/2023   DG Chest 2 View Result Date: 08/06/2023 CLINICAL DATA:  Productive cough.   History of COPD. EXAM: CHEST - 2 VIEW COMPARISON:  Chest CT 07/04/2023 FINDINGS: The heart is normal in size. Stable mediastinal contours. The lungs are hyperinflated. Bronchial thickening without focal airspace disease. No pulmonary edema. No pleural effusion or pneumothorax. No acute osseous findings. IMPRESSION: Chronic hyperinflation. Bronchial thickening without focal airspace disease. Electronically Signed   By: Narda Rutherford M.D.   On: 08/06/2023 12:57     Assessment & Plan:  COPD (chronic obstructive pulmonary disease) with chronic bronchitis (  HCC) -     DG Chest 2 View; Future -     Trelegy Ellipta; Inhale 1 puff into the lungs daily.  Dispense: 120 each; Refill: 0  PAD (peripheral artery disease) (HCC) - Will continue the statin.  Primary hypertension- BP is well controlled.  Coarse tremors -     Ambulatory referral to Neurology -     Thyroid Panel With TSH; Future -     Thyroid peroxidase antibody; Future  TSH elevation- He has subclinical hypoT. -     Thyroid Panel With TSH; Future -     Thyroid peroxidase antibody; Future  Cough productive of purulent sputum -     DG Chest 2 View; Future     Follow-up: Return in about 6 months (around 02/03/2024).  Sanda Linger, MD

## 2023-08-07 LAB — THYROID PANEL WITH TSH
Free Thyroxine Index: 2.2 (ref 1.4–3.8)
T3 Uptake: 28 % (ref 22–35)
T4, Total: 7.7 ug/dL (ref 4.9–10.5)
TSH: 4.53 m[IU]/L — ABNORMAL HIGH (ref 0.40–4.50)

## 2023-08-07 LAB — THYROID PEROXIDASE ANTIBODY: Thyroperoxidase Ab SerPl-aCnc: <1 [IU]/mL (ref <9)

## 2023-08-07 MED ORDER — TRELEGY ELLIPTA 100-62.5-25 MCG/ACT IN AEPB: 1.0000 | INHALATION_SPRAY | Freq: Every day | RESPIRATORY_TRACT | 0 refills | Status: DC

## 2023-08-08 ENCOUNTER — Other Ambulatory Visit: Payer: Self-pay

## 2023-08-08 ENCOUNTER — Inpatient Hospital Stay (HOSPITAL_COMMUNITY): Payer: Self-pay | Admitting: Physician Assistant

## 2023-08-08 ENCOUNTER — Inpatient Hospital Stay (HOSPITAL_COMMUNITY)
Admission: RE | Admit: 2023-08-08 | Discharge: 2023-08-09 | DRG: 269 | Disposition: A | Discharge status: Home / Self Care | Payer: 59 | Attending: Surgery | Admitting: Surgery

## 2023-08-08 ENCOUNTER — Encounter (HOSPITAL_COMMUNITY): Payer: Self-pay | Admitting: Surgery

## 2023-08-08 ENCOUNTER — Encounter (HOSPITAL_COMMUNITY)
Admission: RE | Disposition: A | Discharge status: Home / Self Care | Payer: Self-pay | Source: Home / Self Care | Attending: Surgery

## 2023-08-08 ENCOUNTER — Inpatient Hospital Stay (HOSPITAL_COMMUNITY): Payer: 59

## 2023-08-08 ENCOUNTER — Inpatient Hospital Stay (HOSPITAL_COMMUNITY): Payer: Self-pay

## 2023-08-08 DIAGNOSIS — I739 Peripheral vascular disease, unspecified: Secondary | ICD-10-CM | POA: Diagnosis present

## 2023-08-08 DIAGNOSIS — Z87891 Personal history of nicotine dependence: Secondary | ICD-10-CM | POA: Diagnosis not present

## 2023-08-08 DIAGNOSIS — I10 Essential (primary) hypertension: Secondary | ICD-10-CM | POA: Diagnosis present

## 2023-08-08 DIAGNOSIS — Z833 Family history of diabetes mellitus: Secondary | ICD-10-CM | POA: Diagnosis not present

## 2023-08-08 DIAGNOSIS — F17210 Nicotine dependence, cigarettes, uncomplicated: Secondary | ICD-10-CM | POA: Diagnosis present

## 2023-08-08 DIAGNOSIS — I7143 Infrarenal abdominal aortic aneurysm, without rupture: Secondary | ICD-10-CM | POA: Diagnosis present

## 2023-08-08 DIAGNOSIS — J449 Chronic obstructive pulmonary disease, unspecified: Secondary | ICD-10-CM | POA: Diagnosis present

## 2023-08-08 DIAGNOSIS — Z79899 Other long term (current) drug therapy: Secondary | ICD-10-CM

## 2023-08-08 DIAGNOSIS — I714 Abdominal aortic aneurysm, without rupture, unspecified: Secondary | ICD-10-CM | POA: Diagnosis present

## 2023-08-08 DIAGNOSIS — E7800 Pure hypercholesterolemia, unspecified: Secondary | ICD-10-CM | POA: Diagnosis present

## 2023-08-08 DIAGNOSIS — Z7982 Long term (current) use of aspirin: Secondary | ICD-10-CM

## 2023-08-08 HISTORY — PX: ABDOMINAL AORTIC ENDOVASCULAR STENT GRAFT: SHX5707

## 2023-08-08 LAB — BASIC METABOLIC PANEL
Anion gap: 11 (ref 5–15)
BUN: 15 mg/dL (ref 8–23)
CO2: 24 mmol/L (ref 22–32)
Calcium: 8.4 mg/dL — ABNORMAL LOW (ref 8.9–10.3)
Chloride: 104 mmol/L (ref 98–111)
Creatinine, Ser: 1.1 mg/dL (ref 0.61–1.24)
GFR, Estimated: >60 mL/min (ref >60)
Glucose, Bld: 116 mg/dL — ABNORMAL HIGH (ref 70–99)
Potassium: 4.8 mmol/L (ref 3.5–5.1)
Sodium: 139 mmol/L (ref 135–145)

## 2023-08-08 LAB — CBC
HCT: 46.8 % (ref 39.0–52.0)
Hemoglobin: 15.6 g/dL (ref 13.0–17.0)
MCH: 29.6 pg (ref 26.0–34.0)
MCHC: 33.3 g/dL (ref 30.0–36.0)
MCV: 88.8 fL (ref 80.0–100.0)
Platelets: 149 10*3/uL — ABNORMAL LOW (ref 150–400)
RBC: 5.27 MIL/uL (ref 4.22–5.81)
RDW: 13.7 % (ref 11.5–15.5)
WBC: 7 10*3/uL (ref 4.0–10.5)
nRBC: 0 % (ref 0.0–0.2)

## 2023-08-08 LAB — APTT: aPTT: 29 s (ref 24–36)

## 2023-08-08 LAB — ABO/RH: ABO/RH(D): AB POS

## 2023-08-08 LAB — PROTIME-INR
INR: 1.1 (ref 0.8–1.2)
Prothrombin Time: 14.3 s (ref 11.4–15.2)

## 2023-08-08 LAB — PREPARE RBC (CROSSMATCH)

## 2023-08-08 LAB — MAGNESIUM: Magnesium: 2 mg/dL (ref 1.7–2.4)

## 2023-08-08 SURGERY — INSERTION, ENDOVASCULAR STENT GRAFT, AORTA, ABDOMINAL
Anesthesia: General

## 2023-08-08 MED ORDER — METOPROLOL TARTRATE 5 MG/5ML IV SOLN: 2.0000 mg | INTRAVENOUS | Status: DC | PRN

## 2023-08-08 MED ORDER — CHLORHEXIDINE GLUCONATE 0.12 % MT SOLN
OROMUCOSAL | Status: AC
  Administered 2023-08-08: 15 mL via OROMUCOSAL
  Filled 2023-08-08: qty 15

## 2023-08-08 MED ORDER — PROPOFOL 10 MG/ML IV BOLUS
INTRAVENOUS | Status: DC | PRN
  Administered 2023-08-08: 150 mg via INTRAVENOUS

## 2023-08-08 MED ORDER — PANTOPRAZOLE SODIUM 40 MG PO TBEC
40.0000 mg | DELAYED_RELEASE_TABLET | Freq: Every day | ORAL | Status: DC
  Filled 2023-08-08 (×2): qty 1

## 2023-08-08 MED ORDER — MIDAZOLAM HCL 2 MG/2ML IJ SOLN
INTRAMUSCULAR | Status: AC
  Filled 2023-08-08: qty 2

## 2023-08-08 MED ORDER — LIDOCAINE 2% (20 MG/ML) 5 ML SYRINGE
INTRAMUSCULAR | Status: DC | PRN
  Administered 2023-08-08: 60 mg via INTRAVENOUS

## 2023-08-08 MED ORDER — ONDANSETRON HCL 4 MG/2ML IJ SOLN
4.0000 mg | Freq: Four times a day (QID) | INTRAMUSCULAR | Status: DC | PRN
  Administered 2023-08-08: 4 mg via INTRAVENOUS
  Filled 2023-08-08: qty 2

## 2023-08-08 MED ORDER — HYDRALAZINE HCL 20 MG/ML IJ SOLN: 5.0000 mg | INTRAMUSCULAR | Status: DC | PRN

## 2023-08-08 MED ORDER — POTASSIUM CHLORIDE CRYS ER 20 MEQ PO TBCR
20.0000 meq | EXTENDED_RELEASE_TABLET | Freq: Every day | ORAL | Status: DC | PRN
Start: 2023-08-08 — End: 2023-08-09

## 2023-08-08 MED ORDER — DEXMEDETOMIDINE HCL IN NACL 80 MCG/20ML IV SOLN
INTRAVENOUS | Status: DC | PRN
  Administered 2023-08-08: 16 ug via INTRAVENOUS

## 2023-08-08 MED ORDER — CEFAZOLIN SODIUM-DEXTROSE 2-4 GM/100ML-% IV SOLN
INTRAVENOUS | Status: AC
  Filled 2023-08-08: qty 100

## 2023-08-08 MED ORDER — CEFAZOLIN SODIUM-DEXTROSE 2-4 GM/100ML-% IV SOLN
2.0000 g | Freq: Three times a day (TID) | INTRAVENOUS | Status: AC
  Administered 2023-08-08 – 2023-08-09 (×2): 2 g via INTRAVENOUS
  Filled 2023-08-08 (×2): qty 100

## 2023-08-08 MED ORDER — BISACODYL 5 MG PO TBEC: 5.0000 mg | DELAYED_RELEASE_TABLET | Freq: Every day | ORAL | Status: DC | PRN

## 2023-08-08 MED ORDER — EPHEDRINE SULFATE-NACL 50-0.9 MG/10ML-% IV SOSY
PREFILLED_SYRINGE | INTRAVENOUS | Status: DC | PRN
  Administered 2023-08-08: 10 mg via INTRAVENOUS

## 2023-08-08 MED ORDER — PHENYLEPHRINE 80 MCG/ML (10ML) SYRINGE FOR IV PUSH (FOR BLOOD PRESSURE SUPPORT)
PREFILLED_SYRINGE | INTRAVENOUS | Status: DC | PRN
  Administered 2023-08-08: 80 ug via INTRAVENOUS
  Administered 2023-08-08: 160 ug via INTRAVENOUS

## 2023-08-08 MED ORDER — ALBUMIN HUMAN 5 % IV SOLN: INTRAVENOUS | Status: DC | PRN

## 2023-08-08 MED ORDER — LIDOCAINE 2% (20 MG/ML) 5 ML SYRINGE
INTRAMUSCULAR | Status: AC
  Filled 2023-08-08: qty 5

## 2023-08-08 MED ORDER — CHLORHEXIDINE GLUCONATE CLOTH 2 % EX PADS: 6.0000 | MEDICATED_PAD | Freq: Once | CUTANEOUS | Status: DC

## 2023-08-08 MED ORDER — SODIUM CHLORIDE 0.9% FLUSH: 3.0000 mL | INTRAVENOUS | Status: DC | PRN

## 2023-08-08 MED ORDER — LABETALOL HCL 5 MG/ML IV SOLN: 10.0000 mg | INTRAVENOUS | Status: DC | PRN

## 2023-08-08 MED ORDER — SENNOSIDES-DOCUSATE SODIUM 8.6-50 MG PO TABS: 1.0000 | ORAL_TABLET | Freq: Every evening | ORAL | Status: DC | PRN

## 2023-08-08 MED ORDER — PHENOL 1.4 % MT LIQD: 1.0000 | OROMUCOSAL | Status: DC | PRN

## 2023-08-08 MED ORDER — ONDANSETRON HCL 4 MG/2ML IJ SOLN
INTRAMUSCULAR | Status: AC
  Filled 2023-08-08: qty 2

## 2023-08-08 MED ORDER — SODIUM CHLORIDE 0.9 % IV SOLN: 250.0000 mL | INTRAVENOUS | Status: DC | PRN

## 2023-08-08 MED ORDER — SODIUM CHLORIDE 0.9 % IV SOLN: INTRAVENOUS | Status: DC

## 2023-08-08 MED ORDER — ACETAMINOPHEN 650 MG RE SUPP: 325.0000 mg | RECTAL | Status: DC | PRN

## 2023-08-08 MED ORDER — CEFAZOLIN SODIUM-DEXTROSE 2-4 GM/100ML-% IV SOLN
2.0000 g | INTRAVENOUS | Status: AC
  Administered 2023-08-08: 2 g via INTRAVENOUS

## 2023-08-08 MED ORDER — PROPOFOL 10 MG/ML IV BOLUS
INTRAVENOUS | Status: AC
  Filled 2023-08-08: qty 20

## 2023-08-08 MED ORDER — SODIUM CHLORIDE 0.9% FLUSH
3.0000 mL | Freq: Two times a day (BID) | INTRAVENOUS | Status: DC
  Administered 2023-08-08: 3 mL via INTRAVENOUS

## 2023-08-08 MED ORDER — ROCURONIUM BROMIDE 10 MG/ML (PF) SYRINGE
PREFILLED_SYRINGE | INTRAVENOUS | Status: DC | PRN
  Administered 2023-08-08: 80 mg via INTRAVENOUS
  Administered 2023-08-08: 20 mg via INTRAVENOUS

## 2023-08-08 MED ORDER — 0.9 % SODIUM CHLORIDE (POUR BTL) OPTIME
TOPICAL | Status: DC | PRN
  Administered 2023-08-08: 1000 mL

## 2023-08-08 MED ORDER — IRBESARTAN 150 MG PO TABS
150.0000 mg | ORAL_TABLET | Freq: Every day | ORAL | Status: DC
  Filled 2023-08-08 (×2): qty 1

## 2023-08-08 MED ORDER — DEXAMETHASONE SODIUM PHOSPHATE 10 MG/ML IJ SOLN
INTRAMUSCULAR | Status: DC | PRN
  Administered 2023-08-08: 5 mg via INTRAVENOUS

## 2023-08-08 MED ORDER — DEXAMETHASONE SODIUM PHOSPHATE 10 MG/ML IJ SOLN
INTRAMUSCULAR | Status: AC
  Filled 2023-08-08: qty 1

## 2023-08-08 MED ORDER — ORAL CARE MOUTH RINSE: 15.0000 mL | Freq: Once | OROMUCOSAL | Status: AC

## 2023-08-08 MED ORDER — HEPARIN 6000 UNIT IRRIGATION SOLUTION
Status: AC
  Filled 2023-08-08: qty 500

## 2023-08-08 MED ORDER — SODIUM CHLORIDE 0.9 % IV SOLN: 500.0000 mL | Freq: Once | INTRAVENOUS | Status: DC | PRN

## 2023-08-08 MED ORDER — FENTANYL CITRATE (PF) 250 MCG/5ML IJ SOLN
INTRAMUSCULAR | Status: DC | PRN
  Administered 2023-08-08: 100 ug via INTRAVENOUS
  Administered 2023-08-08 (×2): 50 ug via INTRAVENOUS

## 2023-08-08 MED ORDER — HEPARIN SODIUM (PORCINE) 1000 UNIT/ML IJ SOLN
INTRAMUSCULAR | Status: DC | PRN
  Administered 2023-08-08: 4000 [IU] via INTRAVENOUS
  Administered 2023-08-08: 10000 [IU] via INTRAVENOUS
  Administered 2023-08-08: 2000 [IU] via INTRAVENOUS

## 2023-08-08 MED ORDER — MAGNESIUM SULFATE 2 GM/50ML IV SOLN: 2.0000 g | Freq: Every day | INTRAVENOUS | Status: DC | PRN

## 2023-08-08 MED ORDER — GUAIFENESIN-DM 100-10 MG/5ML PO SYRP: 15.0000 mL | ORAL_SOLUTION | ORAL | Status: DC | PRN

## 2023-08-08 MED ORDER — PHENYLEPHRINE 80 MCG/ML (10ML) SYRINGE FOR IV PUSH (FOR BLOOD PRESSURE SUPPORT)
PREFILLED_SYRINGE | INTRAVENOUS | Status: AC
  Filled 2023-08-08: qty 10

## 2023-08-08 MED ORDER — EPHEDRINE 5 MG/ML INJ
INTRAVENOUS | Status: AC
  Filled 2023-08-08: qty 5

## 2023-08-08 MED ORDER — HEPARIN SODIUM (PORCINE) 5000 UNIT/ML IJ SOLN
5000.0000 [IU] | Freq: Three times a day (TID) | INTRAMUSCULAR | Status: DC
  Administered 2023-08-08 – 2023-08-09 (×2): 5000 [IU] via SUBCUTANEOUS
  Filled 2023-08-08 (×2): qty 1

## 2023-08-08 MED ORDER — FENTANYL CITRATE (PF) 250 MCG/5ML IJ SOLN
INTRAMUSCULAR | Status: AC
  Filled 2023-08-08: qty 5

## 2023-08-08 MED ORDER — ONDANSETRON HCL 4 MG/2ML IJ SOLN
INTRAMUSCULAR | Status: DC | PRN
  Administered 2023-08-08: 4 mg via INTRAVENOUS

## 2023-08-08 MED ORDER — CHLORHEXIDINE GLUCONATE 0.12 % MT SOLN: 15.0000 mL | Freq: Once | OROMUCOSAL | Status: AC

## 2023-08-08 MED ORDER — ATORVASTATIN CALCIUM 40 MG PO TABS
40.0000 mg | ORAL_TABLET | Freq: Every day | ORAL | Status: DC
  Filled 2023-08-08 (×2): qty 1

## 2023-08-08 MED ORDER — VASOPRESSIN 20 UNIT/ML IV SOLN
INTRAVENOUS | Status: AC
  Filled 2023-08-08: qty 1

## 2023-08-08 MED ORDER — OXYCODONE HCL 5 MG PO TABS: 5.0000 mg | ORAL_TABLET | ORAL | Status: DC | PRN

## 2023-08-08 MED ORDER — PROTAMINE SULFATE 10 MG/ML IV SOLN
INTRAVENOUS | Status: DC | PRN
  Administered 2023-08-08: 50 mg via INTRAVENOUS

## 2023-08-08 MED ORDER — ROCURONIUM BROMIDE 10 MG/ML (PF) SYRINGE
PREFILLED_SYRINGE | INTRAVENOUS | Status: AC
  Filled 2023-08-08: qty 10

## 2023-08-08 MED ORDER — ACETAMINOPHEN 325 MG PO TABS
325.0000 mg | ORAL_TABLET | ORAL | Status: DC | PRN
Start: 2023-08-08 — End: 2023-08-09

## 2023-08-08 MED ORDER — HYDROMORPHONE HCL 1 MG/ML IJ SOLN
0.5000 mg | INTRAMUSCULAR | Status: DC | PRN
  Administered 2023-08-08: 0.5 mg via INTRAVENOUS
  Filled 2023-08-08: qty 0.5

## 2023-08-08 MED ORDER — SUGAMMADEX SODIUM 200 MG/2ML IV SOLN
INTRAVENOUS | Status: DC | PRN
  Administered 2023-08-08: 400 mg via INTRAVENOUS
  Administered 2023-08-08: 300 mg via INTRAVENOUS

## 2023-08-08 MED ORDER — HEPARIN 6000 UNIT IRRIGATION SOLUTION
Status: DC | PRN
  Administered 2023-08-08: 1

## 2023-08-08 MED ORDER — PHENYLEPHRINE HCL-NACL 20-0.9 MG/250ML-% IV SOLN
INTRAVENOUS | Status: DC | PRN
  Administered 2023-08-08: 25 ug/min via INTRAVENOUS

## 2023-08-08 MED ORDER — MIDAZOLAM HCL 2 MG/2ML IJ SOLN
INTRAMUSCULAR | Status: DC | PRN
  Administered 2023-08-08 (×2): 1 mg via INTRAVENOUS

## 2023-08-08 MED ORDER — ASPIRIN 81 MG PO TBEC
81.0000 mg | DELAYED_RELEASE_TABLET | Freq: Every day | ORAL | Status: DC
  Filled 2023-08-08 (×2): qty 1

## 2023-08-08 MED ORDER — DOCUSATE SODIUM 100 MG PO CAPS: 100.0000 mg | ORAL_CAPSULE | Freq: Every day | ORAL | Status: DC

## 2023-08-08 MED ORDER — SODIUM CHLORIDE 0.9 % IV SOLN: INTRAVENOUS | Status: DC | PRN

## 2023-08-08 MED ORDER — IODIXANOL 320 MG/ML IV SOLN
INTRAVENOUS | Status: DC | PRN
  Administered 2023-08-08: 85.3 mL

## 2023-08-08 MED ORDER — ALUM & MAG HYDROXIDE-SIMETH 200-200-20 MG/5ML PO SUSP: 15.0000 mL | ORAL | Status: DC | PRN

## 2023-08-08 SURGICAL SUPPLY — 63 items
BAG COUNTER SPONGE SURGICOUNT (BAG) ×1 IMPLANT
BLADE CLIPPER SURG (BLADE) ×1 IMPLANT
CANISTER SUCT 3000ML PPV (MISCELLANEOUS) ×1 IMPLANT
CATH ANGIO 5F BER2 65CM (CATHETERS) IMPLANT
CATH BALLOON XXL 14X20X120 (BALLOONS) IMPLANT
CATH BEACON 5.038 65CM KMP-01 (CATHETERS) ×1 IMPLANT
CATH OMNI FLUSH .035X70CM (CATHETERS) ×1 IMPLANT
DERMABOND ADVANCED .7 DNX12 (GAUZE/BANDAGES/DRESSINGS) ×1 IMPLANT
DEVICE CLOSURE PERCLS PRGLD 6F (VASCULAR PRODUCTS) ×4 IMPLANT
DEVICE TORQUE H2O (MISCELLANEOUS) IMPLANT
DEVICE TORQUE KENDALL .025-038 (MISCELLANEOUS) IMPLANT
DEVICE TORQUE SEADRAGON GRN (MISCELLANEOUS) IMPLANT
DRSG TEGADERM 2-3/8X2-3/4 SM (GAUZE/BANDAGES/DRESSINGS) ×2 IMPLANT
ELECT CAUTERY BLADE 6.4 (BLADE) ×1 IMPLANT
ELECT REM PT RETURN 9FT ADLT (ELECTROSURGICAL) ×2
ELECTRODE REM PT RTRN 9FT ADLT (ELECTROSURGICAL) ×2 IMPLANT
ENDOPRO ILIAC 23X10X10 FA (Endovascular Graft) ×1 IMPLANT
ENDOPROSTHESIS EXP VBN 8X79X80 (Endovascular Graft) IMPLANT
ENDOPROSTHESIS ILI 23X10X10 FA (Endovascular Graft) IMPLANT
ENDOPROTH EXP VIABAHN 8X79X80 (Endovascular Graft) ×1 IMPLANT
EXCLDR TRNK ENDO 32X14.5X14 18 (Endovascular Graft) ×1 IMPLANT
EXCLUDER TNK END 32X14.5X14 18 (Endovascular Graft) IMPLANT
EXTENDER ENDOPROSTHESIS 14X7 (Endovascular Graft) IMPLANT
GAUZE SPONGE 2X2 8PLY STRL LF (GAUZE/BANDAGES/DRESSINGS) ×2 IMPLANT
GLIDEWIRE ADV .035X260CM (WIRE) IMPLANT
GLOVE BIO SURGEON STRL SZ7 (GLOVE) IMPLANT
GLOVE BIOGEL PI IND STRL 7.5 (GLOVE) IMPLANT
GLOVE ECLIPSE 7.0 STRL STRAW (GLOVE) IMPLANT
GLOVE SURG SS PI 7.5 STRL IVOR (GLOVE) ×3 IMPLANT
GOWN STRL REUS W/ TWL LRG LVL3 (GOWN DISPOSABLE) ×2 IMPLANT
GOWN STRL REUS W/ TWL XL LVL3 (GOWN DISPOSABLE) ×2 IMPLANT
GRAFT BALLN CATH 65CM (BALLOONS) ×1 IMPLANT
GUIDEWIRE ANGLED .035X150CM (WIRE) IMPLANT
KIT BASIN OR (CUSTOM PROCEDURE TRAY) ×1 IMPLANT
KIT DRAIN CSF ACCUDRAIN (MISCELLANEOUS) IMPLANT
KIT ENCORE 26 ADVANTAGE (KITS) IMPLANT
KIT TURNOVER KIT B (KITS) ×1 IMPLANT
LEG CONTRALATERAL 23X10 (Vascular Products) IMPLANT
NS IRRIG 1000ML POUR BTL (IV SOLUTION) ×1 IMPLANT
PACK ENDOVASCULAR (PACKS) ×1 IMPLANT
PAD ARMBOARD 7.5X6 YLW CONV (MISCELLANEOUS) ×2 IMPLANT
PENCIL BUTTON HOLSTER BLD 10FT (ELECTRODE) ×1 IMPLANT
PERCLOSE PROGLIDE 6F (VASCULAR PRODUCTS) ×4
SET MICROPUNCTURE 5F STIFF (MISCELLANEOUS) ×1 IMPLANT
SHEATH BRITE TIP 8FR 23CM (SHEATH) ×1 IMPLANT
SHEATH DRYSEAL FLEX 12FR 45CM (SHEATH) IMPLANT
SHEATH DRYSEAL FLEX 16FR 33CM (SHEATH) IMPLANT
SHEATH DRYSEAL FLEX 18FR 33CM (SHEATH) IMPLANT
SHEATH PINNACLE 8F 10CM (SHEATH) ×1 IMPLANT
SNARE GOOSENECK 15MM (MISCELLANEOUS) IMPLANT
STENT VIABAHN 8X59X80 LG (Endovascular Graft) IMPLANT
STOPCOCK MORSE 400PSI 3WAY (MISCELLANEOUS) ×1 IMPLANT
SUT PROLENE 5 0 C 1 24 (SUTURE) IMPLANT
SUT VIC AB 2-0 CT1 TAPERPNT 27 (SUTURE) IMPLANT
SUT VIC AB 3-0 SH 27X BRD (SUTURE) IMPLANT
SUT VICRYL 4-0 PS2 18IN ABS (SUTURE) IMPLANT
SYR 20ML LL LF (SYRINGE) ×1 IMPLANT
TOWEL GREEN STERILE (TOWEL DISPOSABLE) ×1 IMPLANT
TRAY FOLEY MTR SLVR 16FR STAT (SET/KITS/TRAYS/PACK) ×1 IMPLANT
TUBING HIGH PRESSURE 120CM (CONNECTOR) ×1 IMPLANT
WIRE AMPLATZ SS-J .035X180CM (WIRE) ×2 IMPLANT
WIRE BENTSON .035X145CM (WIRE) ×2 IMPLANT
WIRE ROSEN-J .035X260CM (WIRE) IMPLANT

## 2023-08-08 NOTE — Interval H&P Note (Signed)
 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  The various methods of treatment have been discussed with the patient and family. After consideration of risks, benefits and other options for treatment, the patient has consented to  Procedure(s): ABDOMINAL AORTIC ENDOVASCULAR STENT GRAFT (N/A) as a surgical intervention.  The patient's history has been reviewed, patient examined, no change in status, stable for surgery.  I have reviewed the patient's chart and labs.  Questions were answered to the patient's satisfaction.     [name]

## 2023-08-08 NOTE — Transfer of Care (Signed)
 Immediate Anesthesia Transfer of Care Note  Patient: Rodney Williams  Procedure(s) Performed: ABDOMINAL AORTIC ENDOVASCULAR STENT GRAFT  Patient Location: PACU  Anesthesia Type:General  Level of Consciousness: awake, drowsy, and patient cooperative  Airway & Oxygen Therapy: Patient Spontanous Breathing and Patient connected to face mask oxygen  Post-op Assessment: Report given to RN, Post -op Vital signs reviewed and stable, and Patient moving all extremities X 4  Post vital signs: Reviewed and stable  Last Vitals:  Vitals Value Taken Time  BP 112/72 08/08/23 1345  Temp    Pulse 85 08/08/23 1346  Resp 7 08/08/23 1346  SpO2 96 % 08/08/23 1346  Vitals shown include unfiled device data.  Last Pain:  Vitals:   08/08/23 0947  PainSc: 0-No pain         Complications: No notable events documented.

## 2023-08-08 NOTE — Anesthesia Procedure Notes (Signed)
 Procedure Name: Intubation Date/Time: 08/08/2023 11:17 AM  Performed by: Worth Peppers, CRNAPre-anesthesia Checklist: Patient identified, Emergency Drugs available, Suction available and Patient being monitored Patient Re-evaluated:Patient Re-evaluated prior to induction Oxygen Delivery Method: Circle System Utilized Preoxygenation: Pre-oxygenation with 100% oxygen Induction Type: IV induction Ventilation: Mask ventilation without difficulty and Oral airway inserted - appropriate to patient size Laryngoscope Size: Mac and 4 Grade View: Grade I Tube type: Oral Number of attempts: 1 Airway Equipment and Method: Stylet and Oral airway Placement Confirmation: ETT inserted through vocal cords under direct vision, positive ETCO2 and breath sounds checked- equal and bilateral Secured at: 23 cm Tube secured with: Tape Dental Injury: Teeth and Oropharynx as per pre-operative assessment

## 2023-08-09 ENCOUNTER — Encounter (HOSPITAL_COMMUNITY): Payer: Self-pay | Admitting: Surgery

## 2023-08-09 DIAGNOSIS — I714 Abdominal aortic aneurysm, without rupture, unspecified: Secondary | ICD-10-CM | POA: Diagnosis not present

## 2023-08-09 LAB — BASIC METABOLIC PANEL
Anion gap: 10 (ref 5–15)
BUN: 16 mg/dL (ref 8–23)
CO2: 25 mmol/L (ref 22–32)
Calcium: 8.5 mg/dL — ABNORMAL LOW (ref 8.9–10.3)
Chloride: 102 mmol/L (ref 98–111)
Creatinine, Ser: 1.05 mg/dL (ref 0.61–1.24)
GFR, Estimated: >60 mL/min (ref >60)
Glucose, Bld: 121 mg/dL — ABNORMAL HIGH (ref 70–99)
Potassium: 4.5 mmol/L (ref 3.5–5.1)
Sodium: 137 mmol/L (ref 135–145)

## 2023-08-09 LAB — CBC
HCT: 45.3 % (ref 39.0–52.0)
Hemoglobin: 15.3 g/dL (ref 13.0–17.0)
MCH: 29.7 pg (ref 26.0–34.0)
MCHC: 33.8 g/dL (ref 30.0–36.0)
MCV: 88 fL (ref 80.0–100.0)
Platelets: 153 10*3/uL (ref 150–400)
RBC: 5.15 MIL/uL (ref 4.22–5.81)
RDW: 13.8 % (ref 11.5–15.5)
WBC: 8.2 10*3/uL (ref 4.0–10.5)
nRBC: 0 % (ref 0.0–0.2)

## 2023-08-09 NOTE — Op Note (Signed)
 Patient name: Rodney Williams MRN: [id number] DOB: [birth year] Sex: male  08/08/2023 Pre-operative Diagnosis: AAA Post-operative diagnosis:  Same Surgeon:  [name] Assistants:  Charlena Robertson Procedure:   #1: Endovascular repair of abdominal aortic aneurysm (65294)   #2: Placement of extension [phone]   #3: Bilateral ultrasound-guided percutaneous common femoral access   #4: Selective injection with catheter in right internal iliac artery (63754)    #5: Iliac branch endoprosthesis (65282) Anesthesia: General Blood Loss: Minimal Specimens: None  Findings: Complete exclusion Devices used: Main body was primary left Gore 32 x 14 x 14.  Ipsilateral left extension was a Gore 14 x 7.  Iliac branch endoprosthesis: Gore 23 x 10 x 10.  Right iliac extension with Gore 23 x 10.  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  Procedure:  The patient was identified in the holding area and taken to [facility] OR ROOM 16  The patient was then placed supine on the table. general anesthesia was administered.  The patient was prepped and draped in the usual sterile fashion.  A time out was called and antibiotics were administered.  Due to the complexity of the case, an assistant was necessary.  The assistant helped with wire exchanges and device deployment as well as sheath removal.  Ultrasound was used to evaluate bilateral common femoral arteries which were widely patent and easily compressible.  Bilateral common femoral arteries were cannulated under ultrasound guidance with a micropuncture needle.  A 018 wire was inserted without resistance followed by placement of a micropuncture sheath.  A Bentson wire was placed.  The subcutaneous tract was dilated with an 8 French dilator.  Pro-glide devices were deployed at the 11:00 and 1 o'clock position for  preclosure.  8 French sheaths were placed.  The patient was fully heparinized.  I initially placed a 16 French sheath up the right side and a 12 French sheath up the left.  Abdominal aortogram was performed.  Next, a Glidewire advantage and a snare from the left side was used to snare the wire and bring it out having through and through access.  The iliac branch device was loaded on the main wire and the Glidewire advantage and advanced up the right side.  I made sure that there was no wire wrap.  The device was then deployed in the appropriate location.  The 12 French sheath on the left side was advanced over the aortic bifurcation and positioned at the gate.  Next using a Glidewire advantage and a Berenstein 2 catheter, the right internal iliac artery was selected.  This was confirmed with a contrast injection.  A Rosen wire was advanced out the right internal iliac system.  Next, a 8 x 79 Gore VBX stent was deployed into the hypogastric artery.  I then molded the gate with a 14 balloon.  The iliac branch system was then removed.  The 12 French sheath on the left side was exchanged out for a 16 French sheath.  The main body device was then inserted.  This was a Gore 32 x 14 x 14.  An abdominal aortogram was performed locating the renal arteries.  The device was then deployed down to the contralateral gate.  The contralateral gate was cannulated with a Bentson wire and a Omni Flush catheter.  I advanced the  catheter into the main body and was able to freely rotated, confirming successful cannulation.  I then placed a bridging graft between the iliac endoprosthesis and the main body.  This was a Gore 23 x 10 device.  I then performed angiography in a RAO position locating the left hypogastric artery.  I then deployed a left sided extension which was a 14 x 7 device.  A MOB balloon was used to mold the proximal and distal attachment sites.  A completion angiogram was performed which showed successful exclusion of  aneurysm however there did appear to be a slight type Ia endoleak.  I then reinserted the MOB balloon and performed a long inflation of the top portion of the graft.  I repeated the angiography and the type I a leak had resolved.  I exchanged out the stiff wires for Bentson wires and removed the sheath securing the arteriotomy site by tightening the Pro-glide devices.  We checked pedal Doppler signals in both feet and they were excellent.  I reversed the heparin  with 50 mg of protamine .  Cautery was used on the subcutaneous tissue followed by Dermabond.  The patient was excessively expected taken recovery in stable condition.  There were no immediate complications.   Disposition: To PACU stable   V. [name], M.D., [facility] [place] Office: [phone] Pager:  [phone]

## 2023-08-09 NOTE — Progress Notes (Signed)
 DISCHARGE NOTE HOME Rodney Williams to be discharged Home per MD order. Discussed prescriptions and follow up appointments with the patient. Prescriptions given to patient; medication list explained in detail. Patient verbalized understanding.  Skin clean, dry and intact without evidence of skin break down, no evidence of skin tears noted. IV catheter discontinued intact. Site without signs and symptoms of complications. Dressing and pressure applied. Pt denies pain at the site currently. No complaints noted.  Patient free of lines, drains, and wounds other than noted on LDA  An After Visit Summary (AVS) was printed and given to the patient. Taken to discharge lounge Patient escorted via wheelchair, and discharged home via private auto.  Peyton SHAUNNA Pepper, RN

## 2023-08-09 NOTE — Progress Notes (Signed)
  Progress Note    08/09/2023 8:00 AM 1 Day Post-Op  Subjective:  feels great, ready to go home. He has walked around without issue    Vitals:   08/08/23 2343 08/09/23 0334  BP: (!) 93/54 115/71  Pulse: 66 63  Resp: 17 10  Temp: 97.9 F (36.6 C) 98.1 F (36.7 C)  SpO2: 94% 98%    Physical Exam: General:  laying in bed, NAD Cardiac:  regular Lungs:  nonlabored Incisions:  bilateral groin cath sites soft without hematoma Extremities:  moving all extremities equally. Intact PT doppler signals bilaterally Abdomen:  soft, nontender, nondistended  CBC    Component Value Date/Time   WBC 8.2 08/09/2023 0331   RBC 5.15 08/09/2023 0331   HGB 15.3 08/09/2023 0331   HGB 17.7 (H) 11/17/2022 1420   HCT 45.3 08/09/2023 0331   PLT 153 08/09/2023 0331   PLT 174 11/17/2022 1420   MCV 88.0 08/09/2023 0331   MCH 29.7 08/09/2023 0331   MCHC 33.8 08/09/2023 0331   RDW 13.8 08/09/2023 0331   LYMPHSABS 2.0 02/22/2023 1046   MONOABS 0.5 02/22/2023 1046   EOSABS 0.2 02/22/2023 1046   BASOSABS 0.0 02/22/2023 1046    BMET    Component Value Date/Time   NA 137 08/09/2023 0331   NA 139 05/08/2023 0853   K 4.5 08/09/2023 0331   CL 102 08/09/2023 0331   CO2 25 08/09/2023 0331   GLUCOSE 121 (H) 08/09/2023 0331   BUN 16 08/09/2023 0331   BUN 17 05/08/2023 0853   CREATININE 1.05 08/09/2023 0331   CREATININE 1.31 (H) 11/17/2022 1420   CALCIUM  8.5 (L) 08/09/2023 0331   GFRNONAA >60 08/09/2023 0331   GFRNONAA >60 11/17/2022 1420   GFRAA 89 (L) 10/06/2011 0620    INR    Component Value Date/Time   INR 1.1 08/08/2023 1843     Intake/Output Summary (Last 24 hours) at 08/09/2023 0800 Last data filed at 08/09/2023 0409 Gross per 24 hour  Intake 650 ml  Output 1650 ml  Net -1000 ml      Assessment/Plan:  63 y.o. male is 1 day post op, s/p: EVAR   -The patient is doing great this morning after his procedure. He denies any pain -He is hemodynamically stable without signs of  blood loss. He remains normotensive. His Hgb is stable at 15.3 -Bilateral groin cath sites are soft without hematoma -BLE warm and well perfused with PT doppler signals -Good UO of 1650cc in 24H. Scr stable at 1.05 -He is tolerating a normal diet and mobilizing without any difficulty. He has voided without difficulty -He is stable for discharge today. We will arrange follow up with Dr.Brabham in 4 wks with CTA   Ahmed Holster, PA-C [facility] [phone] 08/09/2023 8:00 AM

## 2023-08-09 NOTE — Discharge Instructions (Signed)
  [facility]   Discharge Instructions  Endovascular Aortic Aneurysm Repair  Please refer to the following instructions for your post-procedure care. Your surgeon or Physician Assistant will discuss any changes with you.  Activity  You are encouraged to walk as much as you can. You can slowly return to normal activities but must avoid strenuous activity and heavy lifting until your doctor tells you it's OK. Avoid activities such as vacuuming or swinging a golf club. It is normal to feel tired for several weeks after your surgery. Do not drive until your doctor gives the OK and you are no longer taking prescription pain medications. It is also normal to have difficulty with sleep habits, eating, and bowel movements after surgery. These will go away with time.  Bathing/Showering  Shower daily after you go home.  Do not soak in a bathtub, hot tub, or swim until the incision heals completely.  If you have incisions in your groin, wash the groin wounds with soap and water daily and pat dry. (No tub bath-only shower)  Then put a dry gauze or washcloth there to keep this area dry to help prevent wound infection daily and as needed.  Do not use Vaseline or neosporin on your incisions.  Only use soap and water on your incisions and then protect and keep dry.  Incision Care  Shower every day. Clean your incision with mild soap and water. Pat the area dry with a clean towel. You do not need a bandage unless otherwise instructed. Do not apply any ointments or creams to your incision. If you clothing is irritating, you may cover your incision with a dry gauze pad.  Diet  Resume your normal diet. There are no special food restrictions following this procedure. A low fat/low cholesterol diet is recommended for all patients with vascular disease. In order to heal from your surgery, it is CRITICAL to get adequate nutrition. Your body requires vitamins, minerals, and protein.  Vegetables are the best source of vitamins and minerals. Vegetables also provide the perfect balance of protein. Processed food has little nutritional value, so try to avoid this.  Medications  Resume taking all of your medications unless your doctor or nurse practitioner tells you not to. If your incision is causing pain, you may take over-the-counter pain relievers such as acetaminophen  (Tylenol ). If you were prescribed a stronger pain medication, please be aware these medications can cause nausea and constipation. Prevent nausea by taking the medication with a snack or meal. Avoid constipation by drinking plenty of fluids and eating foods with a high amount of fiber, such as fruits, vegetables, and grains.   Follow up  Our office will schedule a follow-up appointment with a CT scan 3-4 weeks after your surgery.  Please call us  immediately for any of the following conditions  Severe or worsening pain in your legs or feet or in your abdomen back or chest. Increased pain, redness, drainage (pus) from your incision site. Increased abdominal pain, bloating, nausea, vomiting or persistent diarrhea. Fever of 101 degrees or higher. Swelling in your leg (s),  Reduce your risk of vascular disease  Stop smoking. If you would like help call [facility] at 1-800-QUIT-NOW (304-085-2009) or [facility] at [phone]. Manage your cholesterol Maintain a desired weight Control your diabetes Keep your blood pressure down  If you have questions, please call the office at [phone].

## 2023-08-10 NOTE — Anesthesia Postprocedure Evaluation (Signed)
 Anesthesia Post Note  Patient: Rodney Williams  Procedure(s) Performed: ABDOMINAL AORTIC ENDOVASCULAR STENT GRAFT     Patient location during evaluation: PACU Anesthesia Type: General Level of consciousness: awake and alert Pain management: pain level controlled Vital Signs Assessment: post-procedure vital signs reviewed and stable Respiratory status: spontaneous breathing, nonlabored ventilation and respiratory function stable Cardiovascular status: blood pressure returned to baseline and stable Postop Assessment: no apparent nausea or vomiting Anesthetic complications: no   No notable events documented.               Rosali Augello

## 2023-08-11 LAB — TYPE AND SCREEN
ABO/RH(D): AB POS
Antibody Screen: NEGATIVE
Unit division: 0
Unit division: 0

## 2023-08-11 LAB — BPAM RBC
Blood Product Expiration Date: 202503082359
Blood Product Expiration Date: 202503082359
Unit Type and Rh: 6200
Unit Type and Rh: 6200

## 2023-08-13 ENCOUNTER — Other Ambulatory Visit: Payer: Self-pay

## 2023-08-13 DIAGNOSIS — I7143 Infrarenal abdominal aortic aneurysm, without rupture: Secondary | ICD-10-CM

## 2023-08-13 NOTE — Discharge Summary (Signed)
 [facility] Discharge Summary   Rodney Williams 04/26/1961 63 y.o. male  MRN: [id number]  Admission Date: 08/08/2023  Discharge Date: 08/09/2023  Physician: [name] MD  Admission Diagnosis: Abdominal aortic aneurysm (AAA) [facility]) [I71.40]  Discharge Day Diagnosis: Abdominal aortic aneurysm (AAA) [facility]) [I71.40]  Hospital Course:  The patient was admitted to the hospital and taken to the operating room on 08/08/2023 and underwent: Endovascular repair of abdominal aortic aneurysm with right iliac branch endoprosthesis.  The pt tolerated the procedure well and was transported to the PACU in good condition.   On POD 1 the patient was doing well without any pain.  Bilateral groin sites were soft without hematoma.  Bilateral lower extremities were well-perfused with intact PT Doppler signals.  His hemoglobin was stable at 15.3 without signs of blood loss.  Vitals were stable.  He was able to mobilize without any difficulty.  He was tolerating a normal diet and voiding without any issue.  He was discharged on POD 1. CBC    Component Value Date/Time   WBC 8.2 08/09/2023 0331   RBC 5.15 08/09/2023 0331   HGB 15.3 08/09/2023 0331   HGB 17.7 (H) 11/17/2022 1420   HCT 45.3 08/09/2023 0331   PLT 153 08/09/2023 0331   PLT 174 11/17/2022 1420   MCV 88.0 08/09/2023 0331   MCH 29.7 08/09/2023 0331   MCHC 33.8 08/09/2023 0331   RDW 13.8 08/09/2023 0331   LYMPHSABS 2.0 02/22/2023 1046   MONOABS 0.5 02/22/2023 1046   EOSABS 0.2 02/22/2023 1046   BASOSABS 0.0 02/22/2023 1046    BMET    Component Value Date/Time   NA 137 08/09/2023 0331   NA 139 05/08/2023 0853   K 4.5 08/09/2023 0331   CL 102 08/09/2023 0331   CO2 25 08/09/2023 0331   GLUCOSE 121 (H) 08/09/2023 0331   BUN 16 08/09/2023 0331   BUN 17 05/08/2023 0853   CREATININE 1.05 08/09/2023 0331   CREATININE 1.31 (H) 11/17/2022 1420   CALCIUM  8.5 (L) 08/09/2023 0331   GFRNONAA >60 08/09/2023 0331   GFRNONAA >60 11/17/2022  1420   GFRAA 89 (L) 10/06/2011 0620       Discharge Instructions     Call MD for:  redness, tenderness, or signs of infection (pain, swelling, redness, odor or green/yellow discharge around incision site)   Complete by: As directed    Call MD for:  severe uncontrolled pain   Complete by: As directed    Call MD for:  temperature >100.4   Complete by: As directed    Diet - low sodium heart healthy   Complete by: As directed    Increase activity slowly   Complete by: As directed        Discharge Diagnosis:  Abdominal aortic aneurysm (AAA) (HCC) [I71.40]  Secondary Diagnosis: Patient Active Problem List   Diagnosis Date Noted   Abdominal aortic aneurysm (AAA) (HCC) 08/08/2023   Coarse tremors 08/06/2023   TSH elevation 08/06/2023   Primary hypertension 02/23/2023   Need for prophylactic vaccination with combined diphtheria-tetanus-pertussis (DTP) vaccine 02/23/2023   Chronic hyperglycemia 02/22/2023   Erythrocytosis 04/13/2022   Screen for colon cancer 10/13/2021   Tobacco abuse 10/15/2020   COPD (chronic obstructive pulmonary disease) with chronic bronchitis (HCC) 05/01/2017   Hyperlipidemia with target low density lipoprotein (LDL) cholesterol less than 70 mg/dL 94/80/7985   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   PAD (peripheral artery disease) (HCC) 10/28/2012   Past Medical History:  Diagnosis Date  Aneurysm of iliac artery (HCC)    Arthritis    Complication of anesthesia    woke up during ulnar nerve surgery   COPD (chronic obstructive pulmonary disease) (HCC)    Hyperlipidemia    Hypertension    Olecranon bursitis of right elbow    Tobacco abuse    smoke for 40 years     Allergies as of 08/09/2023   No Known Allergies      Medication List     TAKE these medications    albuterol  108 (90 Base) MCG/ACT inhaler Commonly known as: VENTOLIN  HFA INHALE 2 PUFFS INTO LUNGS EVERY 6 HOURS AS NEEDED FOR SHORTNESS OF BREATH/WHEEZING   aspirin  EC 81  MG tablet Take 1 tablet (81 mg total) by mouth daily. Swallow whole.   atorvastatin  40 MG tablet Commonly known as: LIPITOR Take 1 tablet (40 mg total) by mouth daily.   icosapent  Ethyl 1 g capsule Commonly known as: Vascepa  Take 2 capsules (2 g total) by mouth 2 (two) times daily.   metoprolol  tartrate 100 MG tablet Commonly known as: LOPRESSOR  Take 1 tablet (100 mg total) by mouth 2 (two) times daily.   olmesartan  20 MG tablet Commonly known as: BENICAR  Take 1 tablet (20 mg total) by mouth daily.   Trelegy Ellipta  100-62.5-25 MCG/ACT Aepb Generic drug: Fluticasone-Umeclidin-Vilant Inhale 1 puff into the lungs daily.        Discharge Instructions:   [facility]  Discharge Instructions Endovascular Aortic Aneurysm Repair  Please refer to the following instructions for your post-procedure care. Your surgeon or Physician Assistant will discuss any changes with you.  Activity  You are encouraged to walk as much as you can. You can slowly return to normal activities but must avoid strenuous activity and heavy lifting until your doctor tells you it's OK. Avoid activities such as vacuuming or swinging a gold club. It is normal to feel tired for several weeks after your surgery. Do not drive until your doctor gives the OK and you are no longer taking prescription pain medications. It is also normal to have difficulty with sleep habits, eating, and bowel movements after surgery. These will go away with time.  Bathing/Showering  You may shower after you go home. If you have an incision, do not soak in a bathtub, hot tub, or swim until the incision heals completely.  Incision Care  Shower every day. Clean your incision with mild soap and water. Pat the area dry with a clean towel. You do not need a bandage unless otherwise instructed. Do not apply any ointments or creams to your incision. If you clothing is irritating, you may cover your incision with  a dry gauze pad.  Diet  Resume your normal diet. There are no special food restrictions following this procedure. A low fat/low cholesterol diet is recommended for all patients with vascular disease. In order to heal from your surgery, it is CRITICAL to get adequate nutrition. Your body requires vitamins, minerals, and protein. Vegetables are the best source of vitamins and minerals. Vegetables also provide the perfect balance of protein. Processed food has little nutritional value, so try to avoid this.  Medications  Resume taking all of your medications unless your doctor or Physician Assistant tells you not to. If your incision is causing pain, you may take over-the-counter pain relievers such as acetaminophen  (Tylenol ). If you were prescribed a stronger pain medication, please be aware these medications can cause nausea and constipation. Prevent nausea by  taking the medication with a snack or meal. Avoid constipation by drinking plenty of fluids and eating foods with a high amount of fiber, such as fruits, vegetables, and grains. Do not take Tylenol  if you are taking prescription pain medications.   Follow up  Our office will schedule a follow-up appointment with a C.T. scan 3-4 weeks after your surgery.  Please call us  immediately for any of the following conditions  Severe or worsening pain in your legs or feet or in your abdomen back or chest. Increased pain, redness, drainage (pus) from your incision sit. Increased abdominal pain, bloating, nausea, vomiting or persistent diarrhea. Fever of 101 degrees or higher. Swelling in your leg (s),  Reduce your risk of vascular disease  Stop smoking. If you would like help call [facility] at 1-800-QUIT-NOW (5615300864) or [place] at [phone]. Manage your cholesterol Maintain a desired weight Control your diabetes Keep your blood pressure down  If you have questions, please call the office at [phone].   Disposition:  Home  Patient's condition: is Excellent  Follow up: 1. Dr. Serene in 4 weeks with CTA protocol   Ahmed Holster, PA-C [facility] [phone] 08/13/2023  8:16 AM   - For VQI Registry use - Post-op:  Time to Extubation: [x]  In OR, [ ]  < 12 hrs, [ ]  12-24 hrs, [ ]  >=24 hrs Vasopressors Req. Post-op: No MI: No., [ ]  Troponin only, [ ]  EKG or Clinical New Arrhythmia: No CHF: No ICU Stay: 0 days Transfusion: No     If yes,  units given  Complications: Resp failure: No., [ ]  Pneumonia, [ ]  Ventilator Chg in renal function: No., [ ]  Inc. Cr > 0.5, [ ]  Temp. Dialysis,  [ ]  Permanent dialysis Leg ischemia: No., no Surgery needed, [ ]  Yes, Surgery needed,  [ ]  Amputation Bowel ischemia: No., [ ]  Medical Rx, [ ]  Surgical Rx Wound complication: No., [ ]  Superficial separation/infection, [ ]  Return to OR Return to OR: No  Return to OR for bleeding: No Stroke: No., [ ]  Minor, [ ]  Major  Discharge medications: Statin use:  Yes  ASA use:  Yes  Plavix use:  No  Beta blocker use:  Yes  ARB use:  Yes ACEI use:  No CCB use:  No

## 2023-08-13 NOTE — Progress Notes (Signed)
Assessment/Plan:   Tremor, by hx  -Reassured patient that I saw no evidence of a neurodegenerative process.  -Neuro examination today was nonfocal and nonlateralizing.  -I did not see much tremor today at all on examination.  Patient did state that he was having a good day and that tremor is somewhat episodic.  I suspect that what he is seeing/feeling may be related to his lung medications for COPD, particularly the albuterol.  He and I discussed this today.  We discussed nature and pathophysiology.  I certainly do not recommend additional medication for this and he did not disagree.  -I did tell him I would want him to follow-up if things worsened or if new neurologic issues have been to arise.  Otherwise, we will see him back on an as-needed basis.  Subjective:   Rodney Williams was seen today in the movement disorders clinic for neurologic consultation at the request of Etta Grandchild, MD.  The consultation is for the evaluation of tremor.  Outside records that were made available to me were reviewed.  Records indicate that tremor has been going on for about 2 months and involves hands and legs.  Patient stopped taking his medications to see if that would help, but there was no change.  Separate from the above, the patient did just have endovascular repair of AAA on August 08, 2023.  Tremor: Yes.     How long has it been going on? 3 months  At rest or with activation?  Unknown - ? both  Fam hx of tremor?  No.  Located where?  "Its my everything."  States its his eyeballs and chest and hands/legs/head.  He isn't sure if others see it or he just feels it  Affected by caffeine:  No. 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  Spills glass of liquid if full:  No.  Affects ADL's (tying shoes, brushing teeth, etc):   No.  Tremor inducing meds:  Yes.  Albuterol (uses it three times per day but better since changed from anoro ellipta to trelogy this week)  Other Specific Symptoms:  Voice: no change Postural symptoms:  Yes.    Falls?  No. Bradykinesia symptoms: no bradykinesia noted Loss of smell:  No. (Better since d/c tobacco) Loss of taste:  No. Urinary Incontinence:  No. Difficulty Swallowing:  No. N/V:  No. Lightheaded:  Yes.  , occasionally if stands up too fast but worse since sx last week Diplopia:  No.  Neuroimaging of the brain has not previously been performed.  ALLERGIES:  No Known Allergies  CURRENT MEDICATIONS:  Current Outpatient Medications  Medication Instructions   albuterol (VENTOLIN HFA) 108 (90 Base) MCG/ACT inhaler INHALE 2 PUFFS INTO LUNGS EVERY 6 HOURS AS NEEDED FOR SHORTNESS OF BREATH/WHEEZING   atorvastatin (LIPITOR) 40 mg, Oral, Daily   Fluticasone-Umeclidin-Vilant (TRELEGY ELLIPTA) 100-62.5-25 MCG/ACT AEPB 1 puff, Inhalation, Daily   olmesartan (BENICAR) 20 mg, Oral, Daily    Objective:   PHYSICAL EXAMINATION:    VITALS:   Vitals:   08/16/23 0941  BP: (!) 142/83  Pulse: 92  SpO2: 94%  Weight: 243 lb (110.2 kg)  Height: 6\' 4"  (1.93 m)    GEN:  The patient appears stated age and is in NAD. HEENT:  Normocephalic, atraumatic.  The mucous membranes are moist. The superficial temporal arteries are without ropiness or tenderness. CV:  RRR Lungs:  CTAB Neck/HEME:  There are no carotid bruits bilaterally.  Neurological examination:  Orientation: The patient is alert and oriented x3.  Cranial nerves: There is good facial symmetry.  Extraocular muscles are intact. The visual fields are full to confrontational testing. The speech is fluent and clear. Soft palate rises symmetrically and there is no tongue deviation. Hearing is intact to conversational tone. Sensation: Sensation is intact to light touch throughout (facial, trunk, extremities). Vibration is intact at  the bilateral big toe.  It is just mildly decreased.  There is no extinction with double simultaneous stimulation.  Motor: Strength is 5/5 in the bilateral upper and lower extremities.   Shoulder shrug is equal and symmetric.  There is no pronator drift. Deep tendon reflexes: Deep tendon reflexes are 2/4 at the bilateral biceps, triceps, brachioradialis, patella and achilles. Plantar responses are downgoing bilaterally.  Movement examination: Tone: There is normal tone in the bilateral upper extremities.  The tone in the lower extremities is normal.  Abnormal movements: There is no rest tremor.  There is no postural tremor.  There is no tremor when given a weight.  He has no trouble with pouring water from 1 glass to another.  No intention tremor.  No tremor with Archimedes spirals.  Coordination:  There is no decremation with RAM's, with any form of RAMS, including alternating supination and pronation of the forearm, hand opening and closing, finger taps, heel taps and toe taps.  Gait and Station: The patient has no difficulty arising out of a deep-seated chair without the use of the hands. The patient's stride length is good with perhaps mild decreased arm swing on the left.   I have reviewed and interpreted the following labs independently   Chemistry      Component Value Date/Time   NA 137 08/09/2023 0331   NA 139 05/08/2023 0853   K 4.5 08/09/2023 0331   CL 102 08/09/2023 0331   CO2 25 08/09/2023 0331   BUN 16 08/09/2023 0331   BUN 17 05/08/2023 0853   CREATININE 1.05 08/09/2023 0331   CREATININE 1.31 (H) 11/17/2022 1420      Component Value Date/Time   CALCIUM 8.5 (L) 08/09/2023 0331   ALKPHOS 60 08/02/2023 1000   AST 22 08/02/2023 1000   AST 19 11/17/2022 1420   ALT 32 08/02/2023 1000   ALT 27 11/17/2022 1420   BILITOT 0.9 08/02/2023 1000   BILITOT 0.5 04/30/2023 0852   BILITOT 0.4 11/17/2022 1420      Lab Results  Component Value Date   TSH 4.53 (H) 08/06/2023   Lab  Results  Component Value Date   WBC 8.2 08/09/2023   HGB 15.3 08/09/2023   HCT 45.3 08/09/2023   MCV 88.0 08/09/2023   PLT 153 08/09/2023   No results found for: "VITAMINB12"    Total time spent on today's visit was 45 minutes, including both face-to-face time and nonface-to-face time.  Time included that spent on review of records (prior notes available to me/labs/imaging if pertinent), discussing treatment and goals, answering patient's questions and coordinating care.  Cc:  Etta Grandchild, MD

## 2023-08-14 LAB — POCT ACTIVATED CLOTTING TIME
Activated Clotting Time: 118 s
Activated Clotting Time: 198 s
Activated Clotting Time: 239 s
Activated Clotting Time: 239 s

## 2023-08-14 NOTE — Addendum Note (Signed)
Addended by: Terrence Dupont A on: 08/14/2023 11:38 AM   Modules accepted: Orders

## 2023-08-16 ENCOUNTER — Encounter: Payer: Self-pay | Admitting: Neurology

## 2023-08-16 ENCOUNTER — Ambulatory Visit: Payer: 59 | Admitting: Neurology

## 2023-08-16 VITALS — BP 142/83 | HR 92 | Ht 76.0 in | Wt 243.0 lb

## 2023-08-16 DIAGNOSIS — R251 Tremor, unspecified: Secondary | ICD-10-CM | POA: Diagnosis not present

## 2023-08-16 DIAGNOSIS — J4489 Other specified chronic obstructive pulmonary disease: Secondary | ICD-10-CM | POA: Diagnosis not present

## 2023-08-16 NOTE — Patient Instructions (Signed)
I think that the tremor you are describing is likely from your lung medications.  I don't recommend any medication for this.  The physicians and staff at [facility] are committed to providing excellent care. You may receive a survey requesting feedback about your experience at our office. We strive to receive "very good" responses to the survey questions. If you feel that your experience would prevent you from giving the office a "very good " response, please contact our office to try to remedy the situation. We may be reached at [phone]. Thank you for taking the time out of your busy day to complete the survey.

## 2023-08-23 ENCOUNTER — Other Ambulatory Visit: Payer: Self-pay | Admitting: Cardiovascular Disease

## 2023-08-23 DIAGNOSIS — I10 Essential (primary) hypertension: Secondary | ICD-10-CM

## 2023-09-03 ENCOUNTER — Ambulatory Visit (HOSPITAL_COMMUNITY)
Admission: RE | Admit: 2023-09-03 | Discharge: 2023-09-03 | Disposition: A | Discharge status: Home / Self Care | Payer: 59 | Source: Ambulatory Visit | Attending: Surgery | Admitting: Surgery

## 2023-09-03 ENCOUNTER — Ambulatory Visit (HOSPITAL_COMMUNITY): Payer: 59

## 2023-09-03 DIAGNOSIS — I7143 Infrarenal abdominal aortic aneurysm, without rupture: Secondary | ICD-10-CM | POA: Insufficient documentation

## 2023-09-03 MED ORDER — IOHEXOL 350 MG/ML SOLN
100.0000 mL | Freq: Once | INTRAVENOUS | Status: AC | PRN
  Administered 2023-09-03: 100 mL via INTRAVENOUS

## 2023-09-03 MED ORDER — SODIUM CHLORIDE (PF) 0.9 % IJ SOLN
INTRAMUSCULAR | Status: AC
  Filled 2023-09-03: qty 50

## 2023-09-10 ENCOUNTER — Ambulatory Visit (INDEPENDENT_AMBULATORY_CARE_PROVIDER_SITE_OTHER): Payer: 59 | Admitting: Surgery

## 2023-09-10 ENCOUNTER — Encounter: Payer: Self-pay | Admitting: Surgery

## 2023-09-10 VITALS — BP 148/90 | HR 72 | Temp 98.2°F | Ht 76.0 in | Wt 243.9 lb

## 2023-09-10 DIAGNOSIS — I7143 Infrarenal abdominal aortic aneurysm, without rupture: Secondary | ICD-10-CM

## 2023-09-10 NOTE — Progress Notes (Signed)
   Patient name: Rodney Williams MRN: [id number] DOB: Sep 13, 1960 Sex: male  REASON FOR VISIT:     Post op  HISTORY OF PRESENT ILLNESS:   Rodney Williams is a 63 y.o. male who is status post endovascular repair of a 5.4 cm abdominal aortic aneurysm and a 4.6 cm right iliac aneurysm on 08/09/2023 using a iliac branch device.  His postoperative course was uncomplicated  He is a current smoker. He takes a statin for hypercholesterolemia. He is on inhalers for COPD from tobacco abuse. He is medically managed for hypertension    CURRENT MEDICATIONS:    Current Outpatient Medications  Medication Sig Dispense Refill   albuterol (VENTOLIN HFA) 108 (90 Base) MCG/ACT inhaler INHALE 2 PUFFS INTO LUNGS EVERY 6 HOURS AS NEEDED FOR SHORTNESS OF BREATH/WHEEZING 18 each 2   Fluticasone-Umeclidin-Vilant (TRELEGY ELLIPTA) 100-62.5-25 MCG/ACT AEPB Inhale 1 puff into the lungs daily. 120 each 0   olmesartan (BENICAR) 20 MG tablet TAKE 1 TABLET BY MOUTH EVERY DAY 90 tablet 2   atorvastatin (LIPITOR) 40 MG tablet Take 1 tablet (40 mg total) by mouth daily. 90 tablet 3   No current facility-administered medications for this visit.    REVIEW OF SYSTEMS:   [X]  denotes positive finding, [ ]  denotes negative finding Cardiac  Comments:  Chest pain or chest pressure:    Shortness of breath upon exertion:    Short of breath when lying flat:    Irregular heart rhythm:    Constitutional    Fever or chills:      PHYSICAL EXAM:   Vitals:   09/10/23 1407  BP: (!) 148/90  Pulse: 72  Temp: 98.2 F (36.8 C)  TempSrc: Temporal  SpO2: 92%  Weight: 243 lb 14.4 oz (110.6 kg)  Height: 6\' 4"  (1.93 m)    GENERAL: The patient is a well-nourished male, in no acute distress. The vital signs are documented above. CARDIOVASCULAR: There is a regular rate and rhythm. PULMONARY: Non-labored respirations Groin incisions are clean and dry without hematoma.  Both feet are warm and  well-perfused  STUDIES:   CT angiogram: 1. Interval deployment of infrarenal bifurcated aortic stent graft, patent without endoleak. 2. Native infrarenal aneurysm sac diameter 5.5 cm (previously 5.4 cm). 3. 4.7 cm fusiform right common iliac aneurysm (previously 4.6), excluded by right limb of the stent graft. 4. Small hiatal hernia. 5. Right inguinal and paraumbilical hernias containing only fat.      MEDICAL ISSUES:   AAA: Status post endovascular repair of abdominal aortic aneurysm and right common iliac aneurysm using a Gore IBE device.  CT scan shows good position of the device without evidence of endoleak.  He will follow-up in 6 months with a ultrasound.  I did not get preoperative popliteal artery evaluation for aneurysmal disease and so I will do that when he returns.  Charlena Cross, MD, FACS [facility] [phone] Pager [phone]

## 2023-09-13 ENCOUNTER — Other Ambulatory Visit: Payer: Self-pay | Admitting: *Deleted

## 2023-09-13 DIAGNOSIS — I7143 Infrarenal abdominal aortic aneurysm, without rupture: Secondary | ICD-10-CM

## 2023-09-13 DIAGNOSIS — I723 Aneurysm of iliac artery: Secondary | ICD-10-CM

## 2023-09-13 DIAGNOSIS — I739 Peripheral vascular disease, unspecified: Secondary | ICD-10-CM

## 2023-09-13 DIAGNOSIS — Z9889 Other specified postprocedural states: Secondary | ICD-10-CM

## 2023-10-06 ENCOUNTER — Other Ambulatory Visit: Payer: Self-pay | Admitting: Internal Medicine

## 2023-10-06 DIAGNOSIS — J4489 Other specified chronic obstructive pulmonary disease: Secondary | ICD-10-CM

## 2023-10-26 ENCOUNTER — Other Ambulatory Visit: Payer: Self-pay | Admitting: *Deleted

## 2023-10-26 DIAGNOSIS — F17210 Nicotine dependence, cigarettes, uncomplicated: Secondary | ICD-10-CM

## 2023-10-26 DIAGNOSIS — Z122 Encounter for screening for malignant neoplasm of respiratory organs: Secondary | ICD-10-CM

## 2023-10-26 DIAGNOSIS — Z87891 Personal history of nicotine dependence: Secondary | ICD-10-CM

## 2023-11-07 IMAGING — CT CT CHEST LUNG CANCER SCREENING LOW DOSE W/O CM
2 of 4 series · 15 of 36 positions shown, 18 images · non-contrast
Comparison: 11/19/2020

CLINICAL DATA: Lung cancer screening. Sixty-nine pack-year history.
Current asymptomatic smoker.



[Series 3: lung thins 1.0 · axial · 0.78mm/px · z∈[-385,-67]mm · 12 of 350 slices shown, 15 images]
[im 16/350  mediastinal]
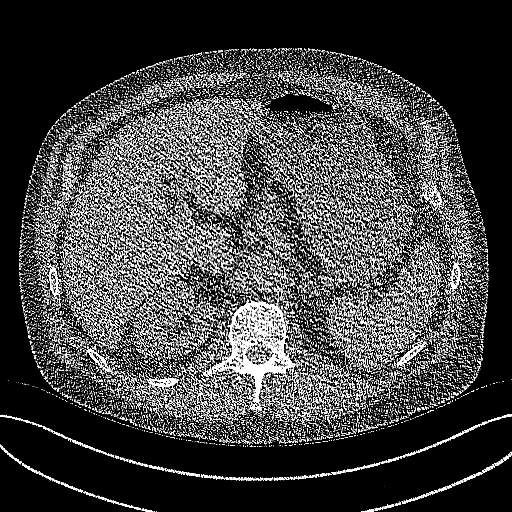
[im 16/350  lung]
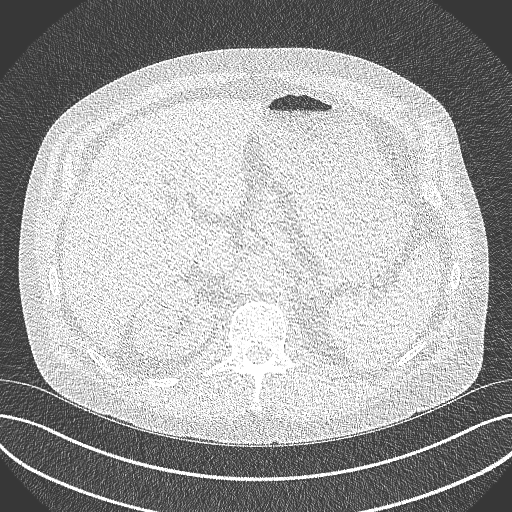
[im 48/350  lung]
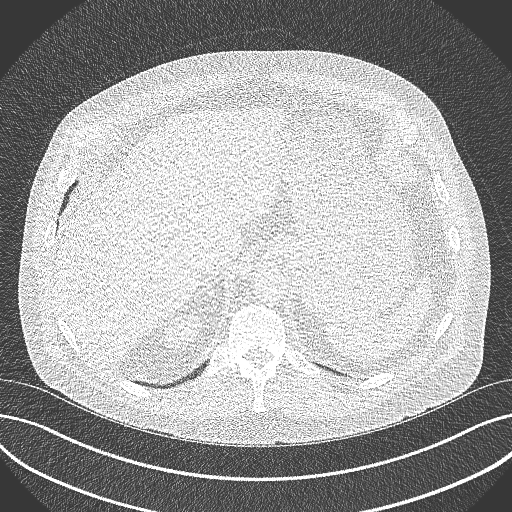
[im 80/350  lung]
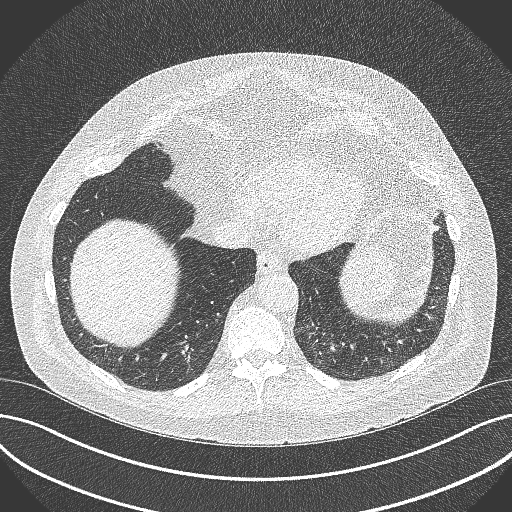
[im 112/350  lung]
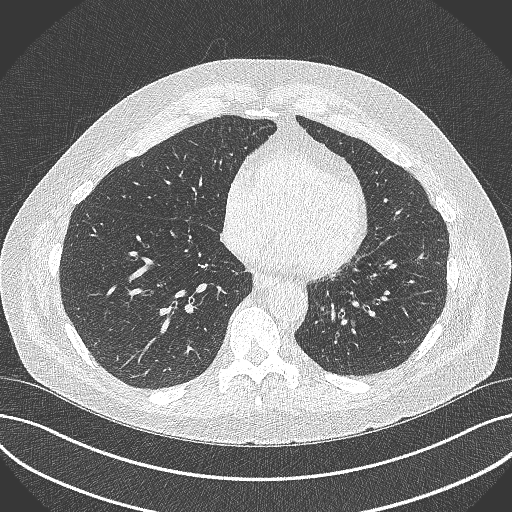
[im 127/350  mediastinal]
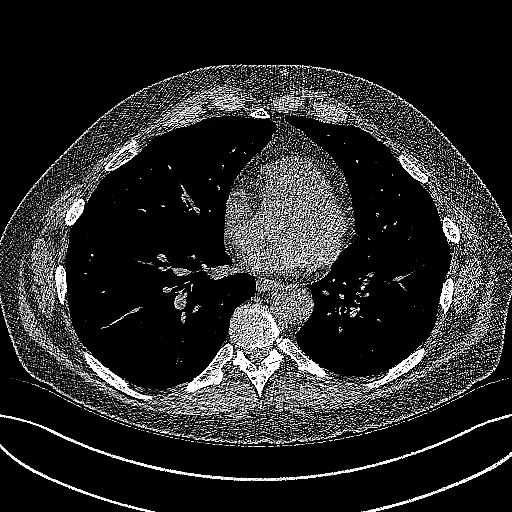
[im 127/350  lung]
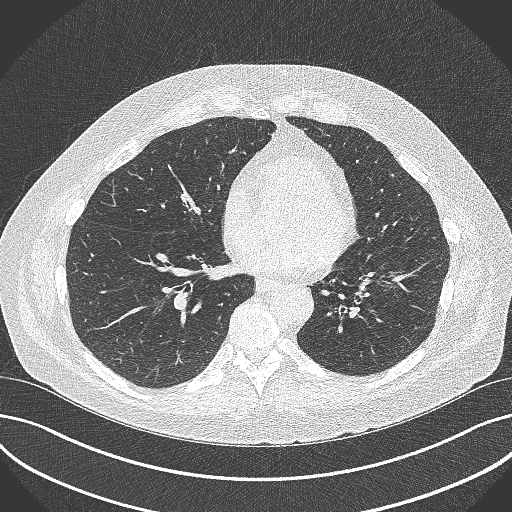
[im 159/350  lung]
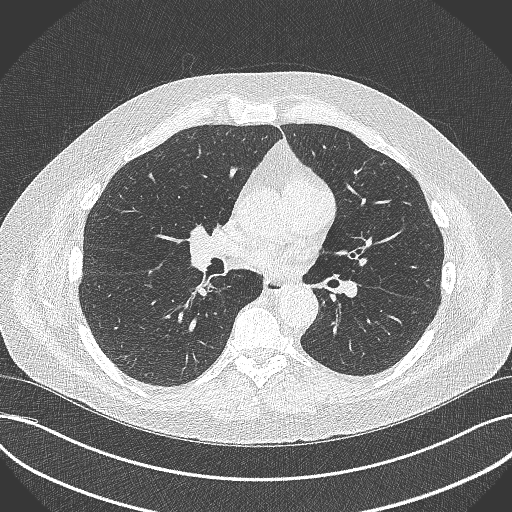
[im 191/350  lung]
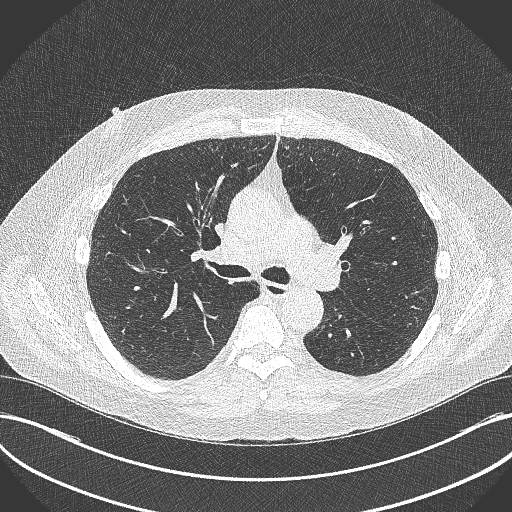
[im 223/350  lung]
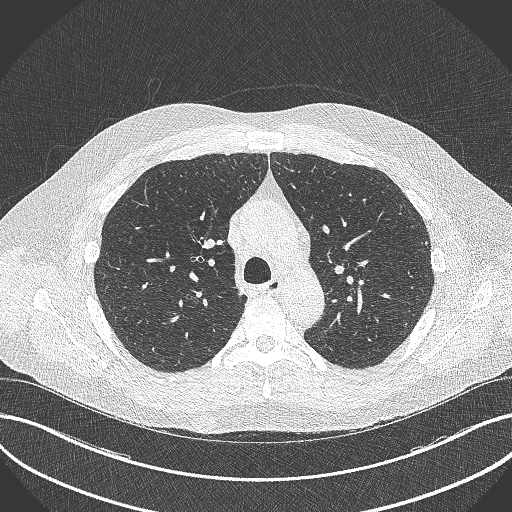
[im 238/350  mediastinal]
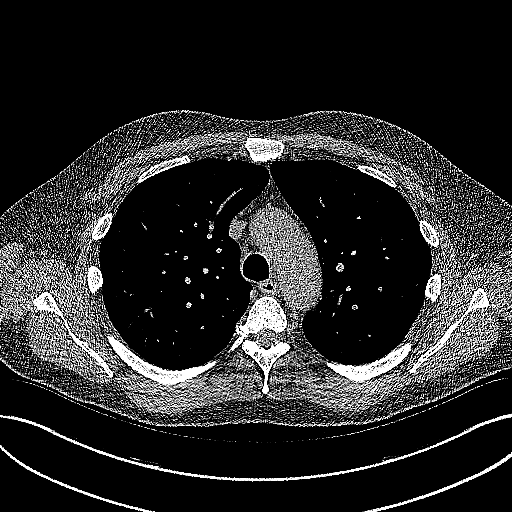
[im 238/350  lung]
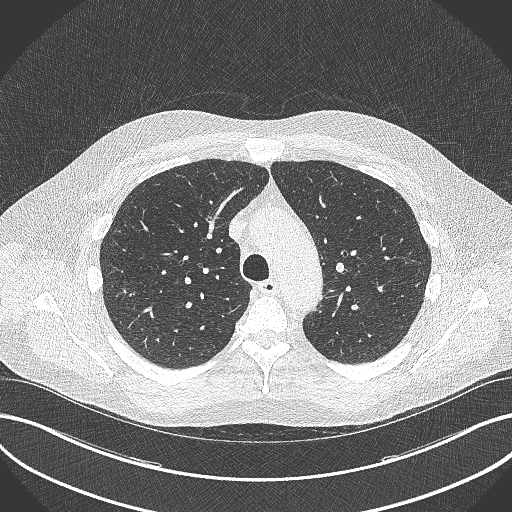
[im 270/350  lung]
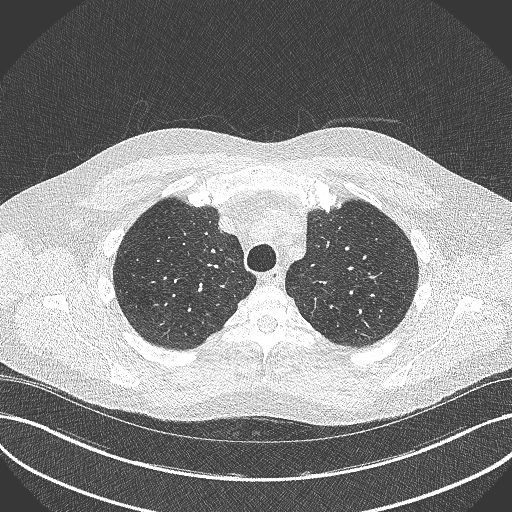
[im 302/350  lung]
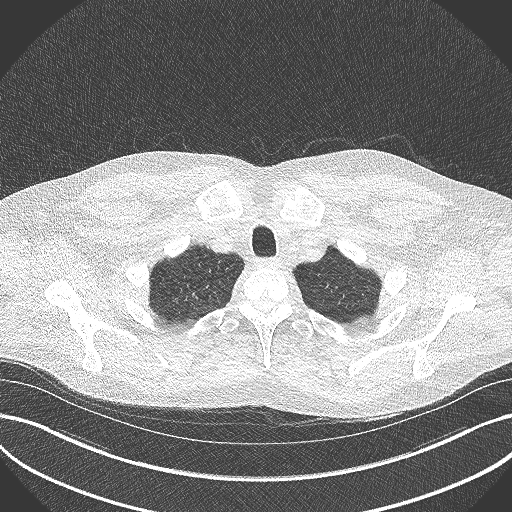
[im 334/350  lung]
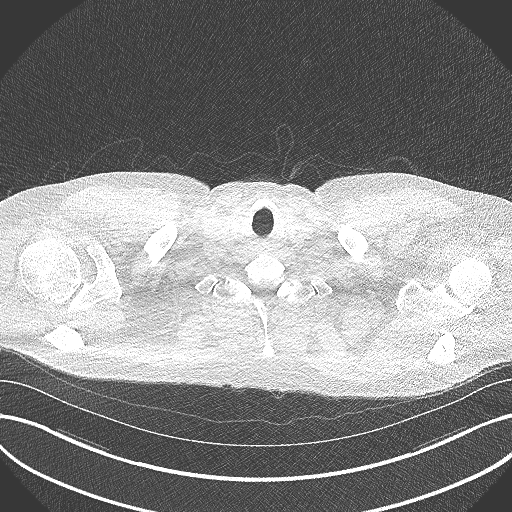

[Series 5: coronal · coronal · 0.71mm/px · 3 of 143 slices shown]
[im 29/143  lung]
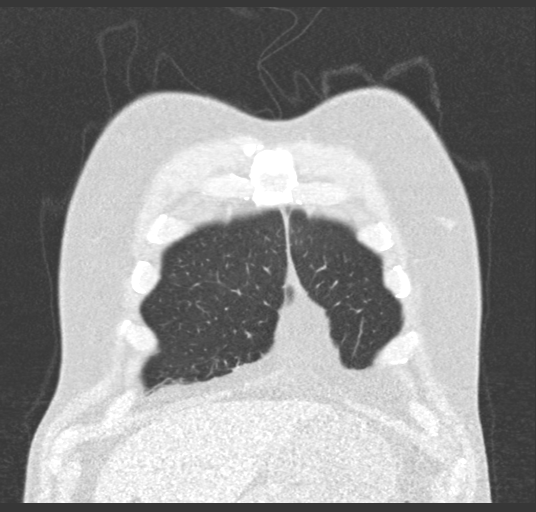
[im 57/143  lung]
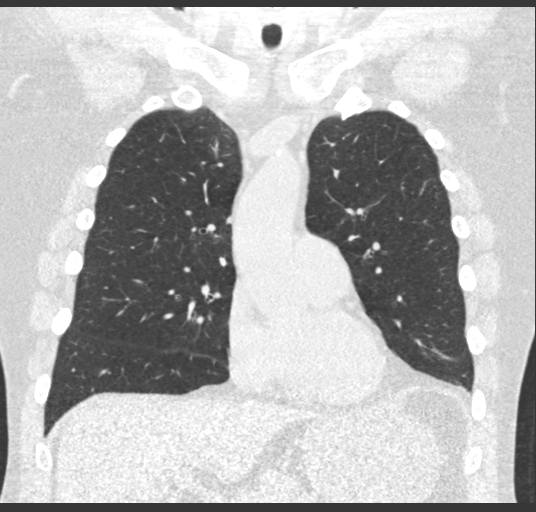
[im 86/143  lung]
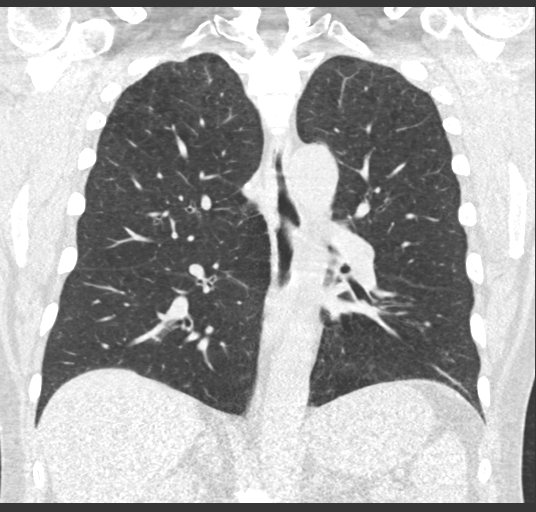

[15 of 36 positions shown; findings below may reference images not displayed]

FINDINGS: Cardiovascular: Heart size normal. No pericardial effusion. Aortic
atherosclerosis.

Mediastinum/Nodes: No enlarged mediastinal, hilar, or axillary lymph
nodes. Thyroid gland, trachea, and esophagus demonstrate no
significant findings.

Lungs/Pleura: Centrilobular and paraseptal emphysema. No pleural
effusion, airspace consolidation, or atelectasis. Unchanged
appearance of right upper lobe calcified granuloma and perifissural
nodule within the right midlung which has a mean derived diameter of
5.3 mm. No new or suspicious lung nodules identified.

Upper Abdomen: No acute abnormality.

Musculoskeletal: No acute or suspicious osseous findings. Bilateral
gynecomastia.
IMPRESSION: 1. Lung-RADS 2, benign appearance or behavior. Continue annual
screening with low-dose chest CT without contrast in 12 months.
2. Aortic Atherosclerosis (JDXPW-R27.7) and Emphysema (JDXPW-K86.Z).

## 2023-12-14 ENCOUNTER — Other Ambulatory Visit: Payer: Self-pay | Admitting: Internal Medicine

## 2023-12-14 DIAGNOSIS — J4489 Other specified chronic obstructive pulmonary disease: Secondary | ICD-10-CM

## 2024-02-26 ENCOUNTER — Other Ambulatory Visit: Payer: Self-pay | Admitting: Internal Medicine

## 2024-02-26 DIAGNOSIS — J4489 Other specified chronic obstructive pulmonary disease: Secondary | ICD-10-CM

## 2024-03-17 ENCOUNTER — Encounter (HOSPITAL_COMMUNITY)

## 2024-03-17 ENCOUNTER — Other Ambulatory Visit (HOSPITAL_COMMUNITY)

## 2024-03-17 ENCOUNTER — Ambulatory Visit

## 2024-04-21 ENCOUNTER — Ambulatory Visit (HOSPITAL_COMMUNITY)
Admission: RE | Admit: 2024-04-21 | Discharge: 2024-04-21 | Disposition: A | Discharge status: Home / Self Care | Source: Ambulatory Visit | Attending: Surgery | Admitting: Surgery

## 2024-04-21 ENCOUNTER — Ambulatory Visit (HOSPITAL_COMMUNITY)
Admission: RE | Admit: 2024-04-21 | Discharge: 2024-04-21 | Disposition: A | Source: Ambulatory Visit | Attending: Surgery | Admitting: Surgery

## 2024-04-21 ENCOUNTER — Ambulatory Visit: Admitting: Physician Assistant

## 2024-04-21 VITALS — BP 136/88 | HR 69 | Temp 97.9°F | Ht 76.0 in | Wt 241.0 lb

## 2024-04-21 DIAGNOSIS — I723 Aneurysm of iliac artery: Secondary | ICD-10-CM | POA: Insufficient documentation

## 2024-04-21 DIAGNOSIS — I739 Peripheral vascular disease, unspecified: Secondary | ICD-10-CM | POA: Diagnosis not present

## 2024-04-21 DIAGNOSIS — Z9889 Other specified postprocedural states: Secondary | ICD-10-CM

## 2024-04-21 DIAGNOSIS — I7143 Infrarenal abdominal aortic aneurysm, without rupture: Secondary | ICD-10-CM | POA: Diagnosis not present

## 2024-04-21 LAB — VAS US ABI WITH/WO TBI
Left ABI: 1.09
Right ABI: 1.08

## 2024-04-21 NOTE — Progress Notes (Signed)
 Office Note     CC:  follow up Requesting Provider:  Joshua Debby CROME, MD  HPI: Rodney Williams is a 63 y.o. ([birth year]) male who presents for routine follow up of EVAR with iliac branch device on 08/09/23 by Dr. Serene. 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 At his last follow up in March with Dr. Serene he was doing great.   Today he reports overall doing well. He denies any abdominal pain or back pain. He does have an umbilical hernia which he says is causing him pain intermittently. He denies any pain in his legs on ambulation or rest. No tissue loss. He is medically managed on Statin. He is current smoker, about 1-1.5 ppd  Past Medical History:  Diagnosis Date   Aneurysm of iliac artery    Arthritis    Complication of anesthesia    woke up during ulnar nerve surgery   COPD (chronic obstructive pulmonary disease) (HCC)    Hyperlipidemia    Hypertension    Olecranon bursitis of right elbow    Tobacco abuse    smoke for 40 years    Past Surgical History:  Procedure Laterality Date   ABDOMINAL AORTIC ENDOVASCULAR STENT GRAFT N/A 08/08/2023   Procedure: ABDOMINAL AORTIC ENDOVASCULAR STENT GRAFT;  Surgeon: Serene Gaile ORN, MD;  Location: [facility] OR;  Service: Vascular;  Laterality: N/A;   INGUINAL HERNIA REPAIR     left   OLECRANON BURSECTOMY Right 12/31/2020   Procedure: RIGHT OLECRANON  BURSITIS EXCISION;  Surgeon: Addie Cordella Hamilton, MD;  Location: [facility];  Service: Orthopedics;  Laterality: Right;   ULNAR NERVE REPAIR     left   ulnar nerve surgery  per pt. 7-8 years ago   Left elbow and wrist    Social History   Socioeconomic History   Marital status: Single    Spouse name: Not on file   Number of children: 0   Years of education: Not on file   Highest education level: Not on file  Occupational History   Occupation: Market researcher: [employer]  Tobacco Use   Smoking status: Former    Current packs/day: 0.00     Average packs/day: 1.5 packs/day for 40.0 years (60.0 ttl pk-yrs)    Types: Cigarettes    Quit date: 04/26/2022    Years since quitting: 1.9   Smokeless tobacco: Never  Vaping Use   Vaping status: Never Used  Substance and Sexual Activity   Alcohol use: Not Currently    Comment: quit drinking in 2004   Drug use: No   Sexual activity: Yes    Partners: Female  Other Topics Concern   Not on file  Social History Narrative   Right handed    Doesn't work    Social Drivers of Corporate investment banker Strain: Not on file  Food Insecurity: No Food Insecurity (08/08/2023)   Hunger Vital Sign    Worried About Running Out of Food in the Last Year: Never true    Ran Out of Food in the Last Year: Never true  Transportation Needs: No Transportation Needs (08/08/2023)   PRAPARE - Administrator, Civil Service (Medical): No    Lack of Transportation (Non-Medical): No  Physical Activity: Not on file  Stress: Not on file  Social Connections: Not on file  Intimate Partner Violence: Not At Risk (08/08/2023)   Humiliation, Afraid, Rape, and Kick questionnaire  Fear of Current or Ex-Partner: No    Emotionally Abused: No    Physically Abused: No    Sexually Abused: No    Family History  Problem Relation Age of Onset   Diabetes Father    Heart attack Sister    Alcohol abuse Neg Hx    Cancer Neg Hx    Early death Neg Hx    Heart disease Neg Hx    Hyperlipidemia Neg Hx    Hypertension Neg Hx    Stroke Neg Hx     Current Outpatient Medications  Medication Sig Dispense Refill   metoprolol  tartrate (LOPRESSOR ) 100 MG tablet Take 100 mg by mouth 2 (two) times daily.     albuterol  (VENTOLIN  HFA) 108 (90 Base) MCG/ACT inhaler INHALE 2 PUFFS INTO LUNGS EVERY 6 HOURS AS NEEDED FOR SHORTNESS OF BREATH/WHEEZING 18 each 2   atorvastatin  (LIPITOR) 40 MG tablet Take 1 tablet (40 mg total) by mouth daily. 90 tablet 3   olmesartan  (BENICAR ) 20 MG tablet TAKE 1 TABLET BY MOUTH EVERY DAY 90  tablet 2   TRELEGY ELLIPTA  100-62.5-25 MCG/ACT AEPB TAKE 1 PUFF BY MOUTH EVERY DAY 60 each 1   No current facility-administered medications for this visit.    No Known Allergies   REVIEW OF SYSTEMS:  Negative unless noted in HPI [X]  denotes positive finding, [ ]  denotes negative finding Cardiac  Comments:  Chest pain or chest pressure:    Shortness of breath upon exertion:    Short of breath when lying flat:    Irregular heart rhythm:        Vascular    Pain in calf, thigh, or hip brought on by ambulation:    Pain in feet at night that wakes you up from your sleep:     Blood clot in your veins:    Leg swelling:         Pulmonary    Oxygen at home:    Productive cough:     Wheezing:         Neurologic    Sudden weakness in arms or legs:     Sudden numbness in arms or legs:     Sudden onset of difficulty speaking or slurred speech:    Temporary loss of vision in one eye:     Problems with dizziness:         Gastrointestinal    Blood in stool:     Vomited blood:         Genitourinary    Burning when urinating:     Blood in urine:        Psychiatric    Major depression:         Hematologic    Bleeding problems:    Problems with blood clotting too easily:        Skin    Rashes or ulcers:        Constitutional    Fever or chills:      PHYSICAL EXAMINATION:  Vitals:   04/21/24 1007  BP: 136/88  Pulse: 69  Temp: 97.9 F (36.6 C)  SpO2: 93%  Weight: 241 lb (109.3 kg)  Height: 6' 4 (1.93 m)    General:  WDWN in NAD; vital signs documented above Gait: Normal HENT: WNL, normocephalic Pulmonary: normal non-labored breathing Cardiac: regular HR Abdomen: soft, no palpable AAA. He does have umbilical hernia that is reducible, tender to palpation Vascular Exam/Pulses: 2+ radial, 2+ femoral, 2+ Dp pulses bilaterally Extremities: without  ischemic changes, without Gangrene , without cellulitis; without open wounds;  Musculoskeletal: no muscle wasting or  atrophy  Neurologic: A&O X 3 Psychiatric:  The pt has Normal affect.   Non-Invasive Vascular Imaging:   VAS US  EVAR Duplex: Summary:  Abdominal Aorta: The largest aortic diameter has decreased compared to prior exam. Previous diameter measurement was 5.5 cm obtained on 09/10/23 .   +-------+-----------+-----------+------------+------------+  ABI/TBIToday's ABIToday's TBIPrevious ABIPrevious TBI  +-------+-----------+-----------+------------+------------+  Right 1.08       0.84       1.16        0.98          +-------+-----------+-----------+------------+------------+  Left  1.09       0.65       1.19        0.87          +-------+-----------+-----------+------------+------------+   VAS US  Lower extremity arterial limited (poplitea aneurysm): Summary:  Right: Patent common femoral and popliteal arteries without evidence of  stenosis, ectasia or aneurysm.   Left: Patent common femoral and popliteal arteries without evidence of  stenosis, ectasia, or aneurysm.   ASSESSMENT/PLAN:: 63 y.o. male here for routine follow up of EVAR with iliac branch device on 08/09/23 by Dr. Serene. 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 -Duplex today shows  patent EVAR with decreased since of the aneurysm from prior study. No endoleak - No evidence of popliteal aneurysms bilaterally on duplex  - ABI overall stable - Continue Statin - will make referral to General Surgery for evaluation of his umbilical hernia - he will follow up in 1 year with EVAR duplex   Teretha Damme, PA-C [facility] [phone]  On call MD:   [place]

## 2024-05-03 ENCOUNTER — Other Ambulatory Visit: Payer: Self-pay | Admitting: Internal Medicine

## 2024-05-03 DIAGNOSIS — J4489 Other specified chronic obstructive pulmonary disease: Secondary | ICD-10-CM

## 2024-05-16 ENCOUNTER — Other Ambulatory Visit: Payer: Self-pay | Admitting: Internal Medicine

## 2024-05-21 ENCOUNTER — Other Ambulatory Visit: Payer: Self-pay | Admitting: Internal Medicine

## 2024-05-21 NOTE — Telephone Encounter (Signed)
 Copied from CRM [id number]. Topic: Clinical - Prescription Issue >> May 21, 2024 10:15 AM Macario HERO wrote: Reason for CRM: Patient called regarding refill for albuterol  (VENTOLIN  HFA) 108 (90 Base) MCG/ACT inhaler [[id number]].

## 2024-05-22 ENCOUNTER — Other Ambulatory Visit: Payer: Self-pay | Admitting: General Practice

## 2024-05-23 MED ORDER — ATORVASTATIN CALCIUM 40 MG PO TABS: 40.0000 mg | ORAL_TABLET | Freq: Every day | ORAL | 0 refills | Status: AC

## 2024-05-26 ENCOUNTER — Other Ambulatory Visit: Payer: Self-pay | Admitting: Internal Medicine

## 2024-05-27 ENCOUNTER — Encounter: Payer: Self-pay | Admitting: Acute Care

## 2024-06-05 ENCOUNTER — Other Ambulatory Visit: Payer: Self-pay | Admitting: General Practice

## 2024-06-05 DIAGNOSIS — I10 Essential (primary) hypertension: Secondary | ICD-10-CM

## 2024-06-06 ENCOUNTER — Other Ambulatory Visit: Payer: Self-pay | Admitting: General Practice

## 2024-06-06 DIAGNOSIS — I10 Essential (primary) hypertension: Secondary | ICD-10-CM

## 2024-06-10 MED ORDER — OLMESARTAN MEDOXOMIL 20 MG PO TABS: 20.0000 mg | ORAL_TABLET | Freq: Every day | ORAL | 0 refills | Status: AC
